# Patient Record
Sex: Female | Born: 1975 | Race: White | Hispanic: No | Marital: Married | Smoking: Former smoker
Health system: Southern US, Community
[De-identification: ages and names within clinical notes are randomized; demographics above are authoritative.]

## PROBLEM LIST (undated history)

## (undated) DIAGNOSIS — Z800 Family history of malignant neoplasm of digestive organs: Secondary | ICD-10-CM

## (undated) DIAGNOSIS — N6019 Diffuse cystic mastopathy of unspecified breast: Secondary | ICD-10-CM

## (undated) DIAGNOSIS — N6012 Diffuse cystic mastopathy of left breast: Secondary | ICD-10-CM

## (undated) DIAGNOSIS — Z803 Family history of malignant neoplasm of breast: Secondary | ICD-10-CM

## (undated) DIAGNOSIS — E079 Disorder of thyroid, unspecified: Secondary | ICD-10-CM

## (undated) DIAGNOSIS — N649 Disorder of breast, unspecified: Secondary | ICD-10-CM

## (undated) HISTORY — DX: Diffuse cystic mastopathy of unspecified breast: N60.19

## (undated) HISTORY — DX: Family history of malignant neoplasm of digestive organs: Z80.0

## (undated) HISTORY — DX: Family history of malignant neoplasm of breast: Z80.3

## (undated) HISTORY — DX: Disorder of breast, unspecified: N64.9

## (undated) HISTORY — PX: BREAST EXCISIONAL BIOPSY: SUR124

## (undated) HISTORY — DX: Diffuse cystic mastopathy of left breast: N60.12

## (undated) HISTORY — DX: Disorder of thyroid, unspecified: E07.9

---

## 1998-01-12 HISTORY — PX: TUBAL LIGATION: SHX77

## 1998-02-17 ENCOUNTER — Observation Stay (HOSPITAL_COMMUNITY): Admission: AD | Admit: 1998-02-17 | Discharge: 1998-02-18 | Payer: Self-pay | Admitting: *Deleted

## 1998-02-21 ENCOUNTER — Inpatient Hospital Stay (HOSPITAL_COMMUNITY): Admission: AD | Admit: 1998-02-21 | Discharge: 1998-02-24 | Payer: Self-pay | Admitting: Obstetrics & Gynecology

## 1998-02-24 ENCOUNTER — Encounter (HOSPITAL_COMMUNITY): Admission: RE | Admit: 1998-02-24 | Discharge: 1998-05-25 | Payer: Self-pay | Admitting: Obstetrics & Gynecology

## 1998-03-30 ENCOUNTER — Ambulatory Visit (HOSPITAL_COMMUNITY): Admission: RE | Admit: 1998-03-30 | Discharge: 1998-03-30 | Payer: Self-pay | Admitting: *Deleted

## 1998-04-01 ENCOUNTER — Inpatient Hospital Stay (HOSPITAL_COMMUNITY): Admission: AD | Admit: 1998-04-01 | Discharge: 1998-04-01 | Payer: Self-pay | Admitting: Obstetrics & Gynecology

## 1998-05-26 ENCOUNTER — Encounter (HOSPITAL_COMMUNITY): Admission: RE | Admit: 1998-05-26 | Discharge: 1998-08-24 | Payer: Self-pay | Admitting: *Deleted

## 2000-08-25 ENCOUNTER — Encounter: Admission: RE | Admit: 2000-08-25 | Discharge: 2000-08-25 | Payer: Self-pay | Admitting: Family Medicine

## 2000-08-25 ENCOUNTER — Encounter: Payer: Self-pay | Admitting: Family Medicine

## 2001-02-15 ENCOUNTER — Encounter: Payer: Self-pay | Admitting: Family Medicine

## 2001-02-15 ENCOUNTER — Encounter: Admission: RE | Admit: 2001-02-15 | Discharge: 2001-02-15 | Payer: Self-pay | Admitting: Family Medicine

## 2002-02-16 ENCOUNTER — Encounter: Payer: Self-pay | Admitting: Family Medicine

## 2002-02-16 ENCOUNTER — Encounter: Admission: RE | Admit: 2002-02-16 | Discharge: 2002-02-16 | Payer: Self-pay | Admitting: Family Medicine

## 2003-02-26 ENCOUNTER — Encounter: Admission: RE | Admit: 2003-02-26 | Discharge: 2003-02-26 | Payer: Self-pay | Admitting: Family Medicine

## 2003-03-06 ENCOUNTER — Encounter: Admission: RE | Admit: 2003-03-06 | Discharge: 2003-03-06 | Payer: Self-pay | Admitting: Family Medicine

## 2004-06-27 ENCOUNTER — Other Ambulatory Visit: Admission: RE | Admit: 2004-06-27 | Discharge: 2004-06-27 | Payer: Self-pay | Admitting: Family Medicine

## 2005-04-23 ENCOUNTER — Encounter: Admission: RE | Admit: 2005-04-23 | Discharge: 2005-04-23 | Payer: Self-pay | Admitting: Family Medicine

## 2006-06-01 ENCOUNTER — Encounter: Admission: RE | Admit: 2006-06-01 | Discharge: 2006-06-01 | Payer: Self-pay | Admitting: Family Medicine

## 2006-06-11 ENCOUNTER — Encounter: Admission: RE | Admit: 2006-06-11 | Discharge: 2006-06-11 | Payer: Self-pay | Admitting: Family Medicine

## 2007-01-13 DIAGNOSIS — E079 Disorder of thyroid, unspecified: Secondary | ICD-10-CM

## 2007-01-13 HISTORY — DX: Disorder of thyroid, unspecified: E07.9

## 2007-01-18 ENCOUNTER — Encounter: Admission: RE | Admit: 2007-01-18 | Discharge: 2007-01-18 | Payer: Self-pay | Admitting: Family Medicine

## 2007-02-22 ENCOUNTER — Encounter: Admission: RE | Admit: 2007-02-22 | Discharge: 2007-02-22 | Payer: Self-pay | Admitting: Family Medicine

## 2007-03-03 ENCOUNTER — Encounter: Admission: RE | Admit: 2007-03-03 | Discharge: 2007-03-03 | Payer: Self-pay | Admitting: Family Medicine

## 2007-04-29 ENCOUNTER — Other Ambulatory Visit: Admission: RE | Admit: 2007-04-29 | Discharge: 2007-04-29 | Payer: Self-pay | Admitting: Family Medicine

## 2007-08-25 ENCOUNTER — Encounter: Admission: RE | Admit: 2007-08-25 | Discharge: 2007-08-25 | Payer: Self-pay | Admitting: Family Medicine

## 2007-08-26 ENCOUNTER — Encounter: Admission: RE | Admit: 2007-08-26 | Discharge: 2007-08-26 | Payer: Self-pay | Admitting: Family Medicine

## 2007-08-26 ENCOUNTER — Encounter (INDEPENDENT_AMBULATORY_CARE_PROVIDER_SITE_OTHER): Payer: Self-pay | Admitting: Diagnostic Radiology

## 2009-07-02 ENCOUNTER — Encounter: Admission: RE | Admit: 2009-07-02 | Discharge: 2009-07-02 | Payer: Self-pay | Admitting: Family Medicine

## 2009-09-30 ENCOUNTER — Other Ambulatory Visit: Admission: RE | Admit: 2009-09-30 | Discharge: 2009-09-30 | Payer: Self-pay | Admitting: Family Medicine

## 2010-02-01 ENCOUNTER — Encounter: Payer: Self-pay | Admitting: Family Medicine

## 2010-02-02 ENCOUNTER — Encounter: Payer: Self-pay | Admitting: Family Medicine

## 2010-08-12 ENCOUNTER — Other Ambulatory Visit: Payer: Self-pay | Admitting: Family Medicine

## 2010-08-12 DIAGNOSIS — N644 Mastodynia: Secondary | ICD-10-CM

## 2010-08-20 ENCOUNTER — Ambulatory Visit
Admission: RE | Admit: 2010-08-20 | Discharge: 2010-08-20 | Disposition: A | Discharge status: Home / Self Care | Payer: 59 | Source: Ambulatory Visit | Attending: Family Medicine | Admitting: Family Medicine

## 2010-08-20 ENCOUNTER — Other Ambulatory Visit: Payer: Self-pay | Admitting: Family Medicine

## 2010-08-20 DIAGNOSIS — N6320 Unspecified lump in the left breast, unspecified quadrant: Secondary | ICD-10-CM

## 2010-08-20 DIAGNOSIS — N644 Mastodynia: Secondary | ICD-10-CM

## 2010-08-20 IMAGING — US US BREAST BILAT
1 series · 13 of 23 positions shown · non-contrast
Comparison: [DATE] and earlier

CLINICAL DATA: Focal pain in the upper inner quadrant of the right
breast. The patient's mother was diagnosed with breast cancer at
age 32 and died of her disease.

DIGITAL DIAGNOSTIC BILATERAL MAMMOGRAM WITH CAD AND BILATERAL
BREAST ULTRASOUND:

[Series 1: us breast bilat · 13 of 23 slices shown]
[im 1/23]
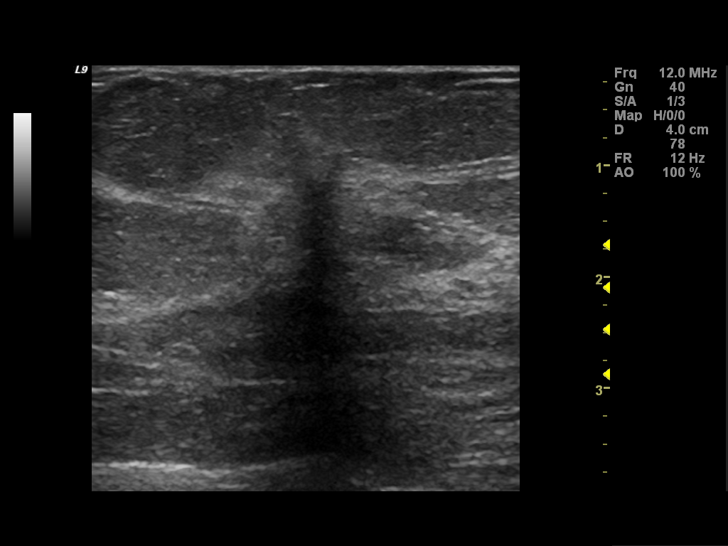
[im 3/23]
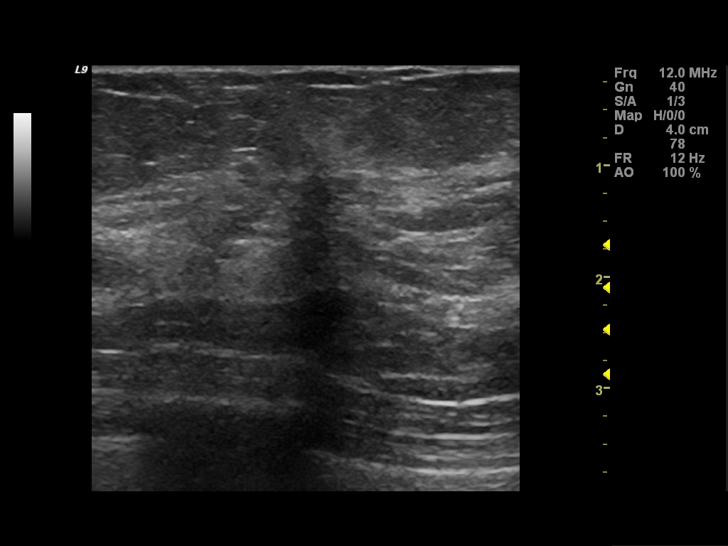
[im 5/23]
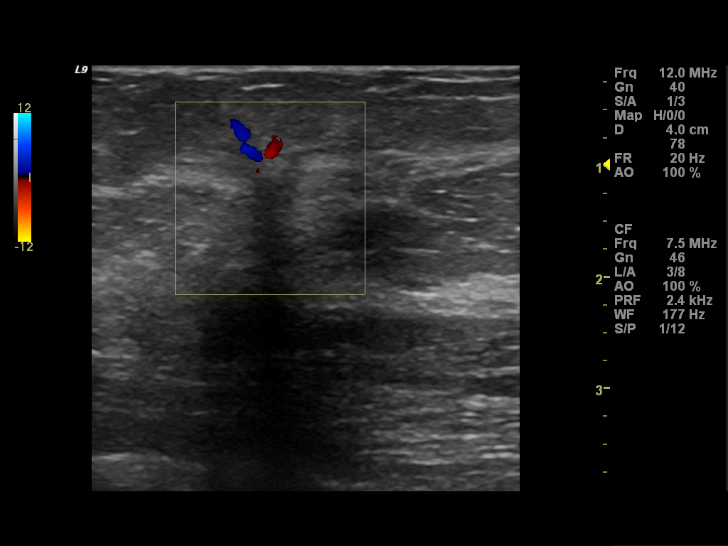
[im 7/23]
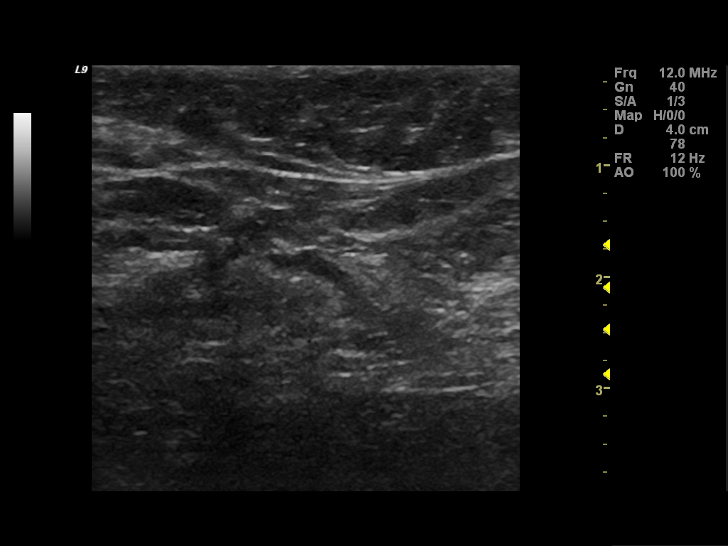
[im 8/23]
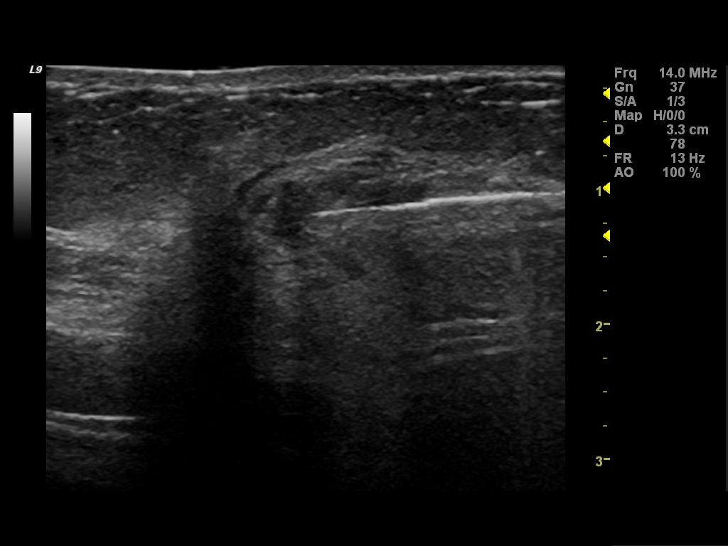
[im 10/23]
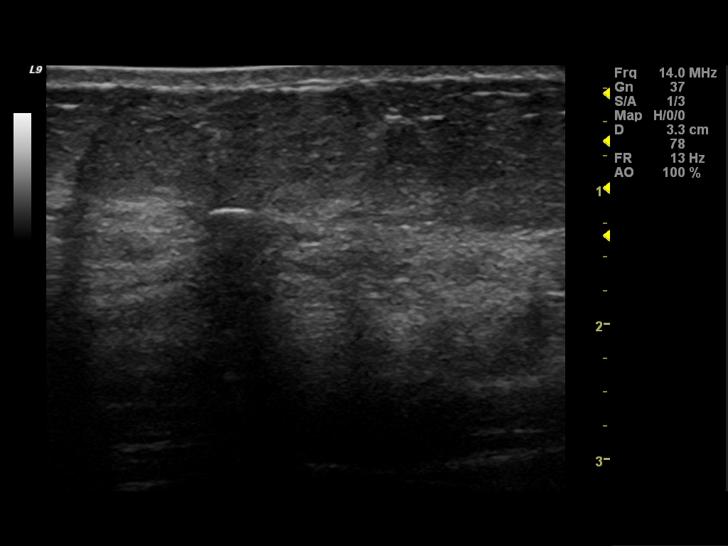
[im 12/23]
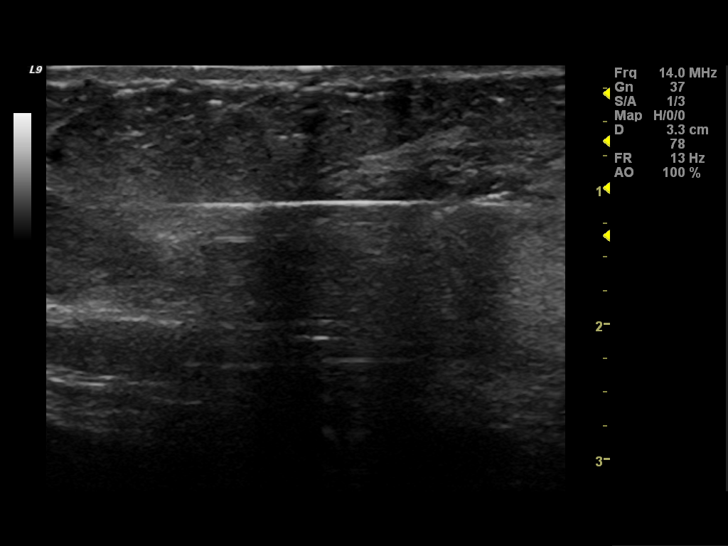
[im 14/23]
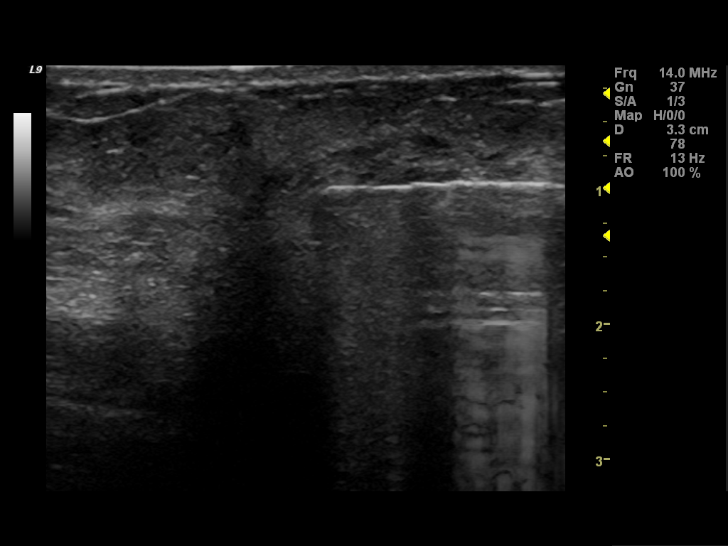
[im 16/23]
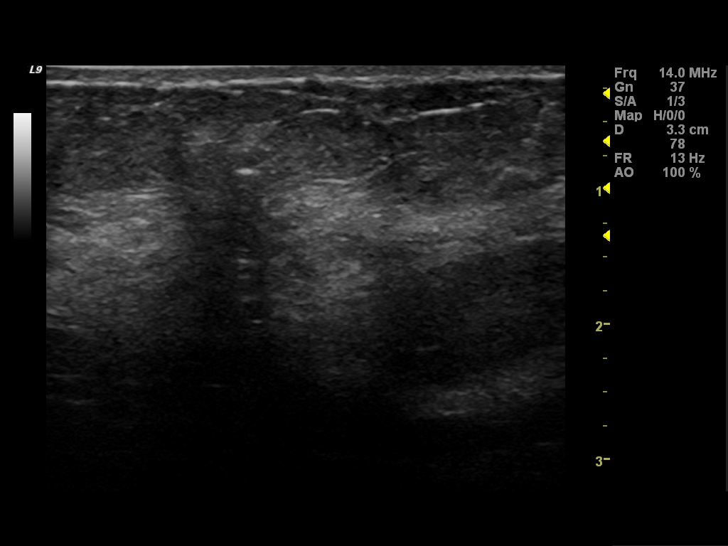
[im 17/23]
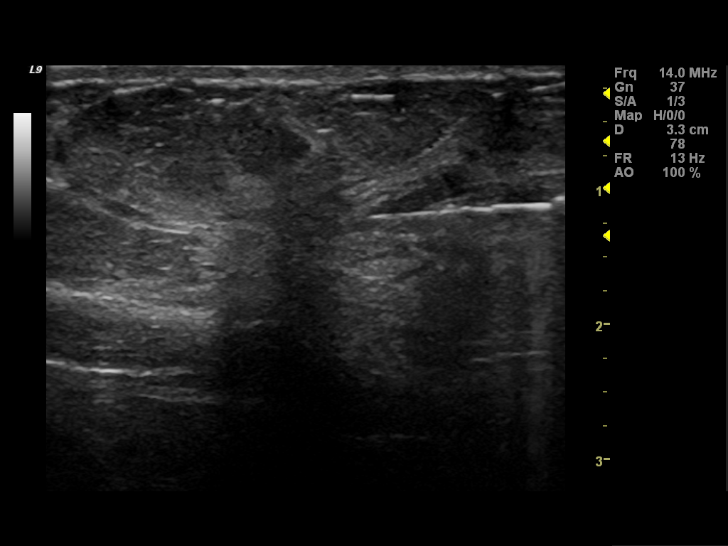
[im 19/23]
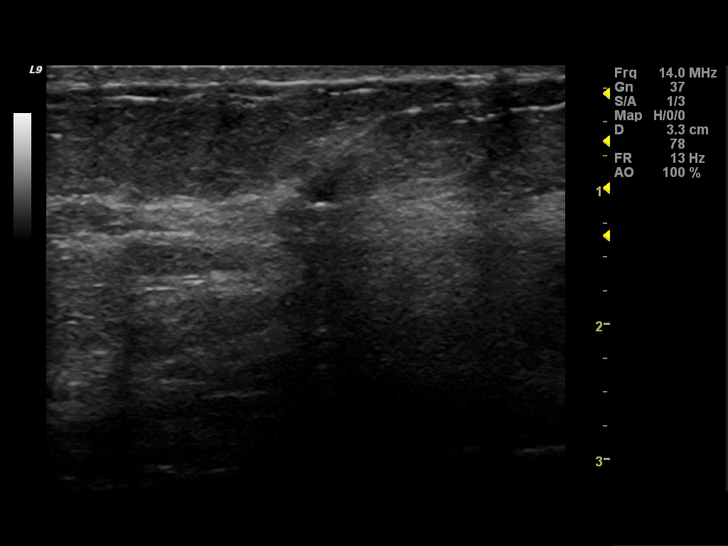
[im 21/23]
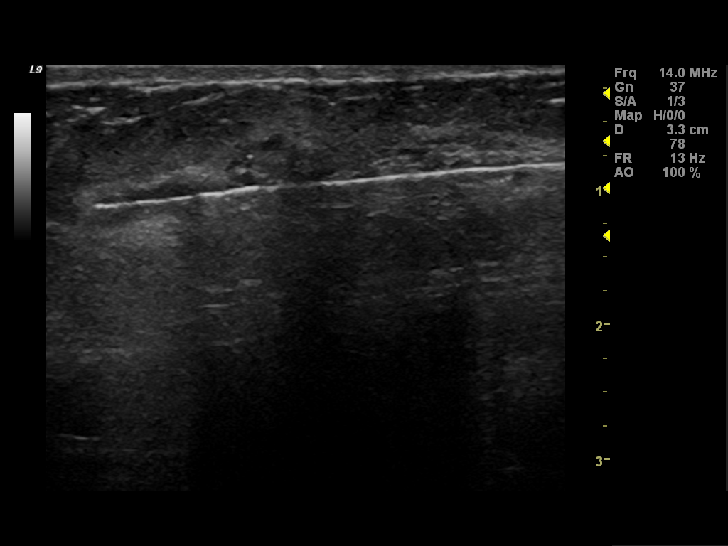
[im 23/23]
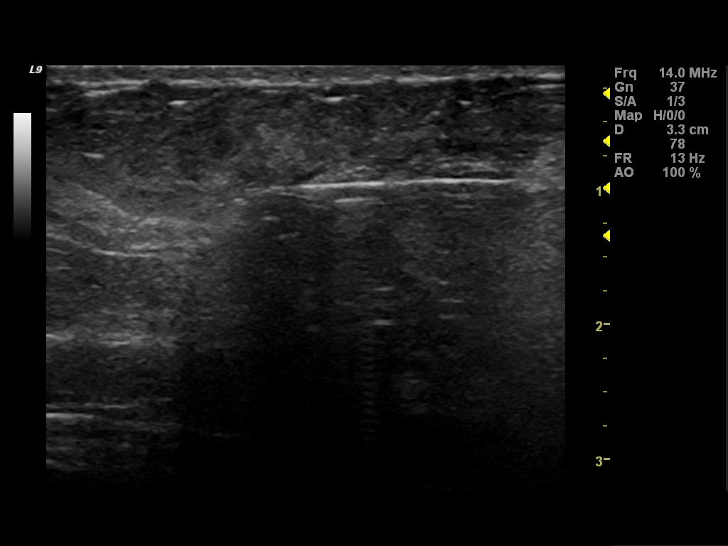

[13 of 23 positions shown; findings below may reference images not displayed]

FINDINGS: Breast parenchyma is heterogeneously dense.  Stable
nodule is identified in the lower inner quadrant of the left
breast, consistent with benign fibroadenoma, followed on multiple
prior ultrasound exams.  Patient has had prior benign biopsy in the
right breast.  A BB marks the area of patient's concern, in the
upper- inner quadrant of the right breast, negative on spot
tangential view.  No suspicious microcalcifications, distortion
identified on the right.

In the upper aspect of the left breast, there is question
distortion, further evaluated with spot compression and rolled
views.  This is thought to be in the medial portion of the left
breast on additional views.
Mammographic images were processed with CAD.

On physical exam, I palpate no abnormality in the upper-outer
quadrant of the right breast, in the area of focal tenderness.  I
palpate no abnormality in the upper quadrant of the left breast, to
correspond with the area of questioned distortion.

Ultrasound is performed, showing normal-appearing fibroglandular
tissue in the upper-outer quadrant of the right breast.

There is an area of acoustic shadowing in the 10 o'clock position
of the left breast 4 cm from the nipple.  This is ill defined and
difficult to measure but is at least 9 x 8 mm.  This correlates
well with the abnormality seen mammographically.  Further
evaluation with ultrasound guided core biopsy is recommended.

Evaluation of the left axilla is negative for suspicious lymph
nodes.
IMPRESSION: 1.  Area of focal pain in the right breast is negative by mammogram
and ultrasound.
2. Area of distortion correlates with a hypoechoic area of
shadowing seen sonographically.  Further evaluation with ultrasound
guided core biopsy is recommended.  The patient requests biopsy
today.  This is performed and dictated separately.

BI-RADS CATEGORY 4:  Suspicious abnormality - biopsy should be
considered.

## 2010-09-08 ENCOUNTER — Ambulatory Visit (INDEPENDENT_AMBULATORY_CARE_PROVIDER_SITE_OTHER): Payer: Self-pay | Admitting: General Surgery

## 2010-09-16 ENCOUNTER — Ambulatory Visit (INDEPENDENT_AMBULATORY_CARE_PROVIDER_SITE_OTHER): Payer: 59 | Admitting: General Surgery

## 2010-09-16 ENCOUNTER — Other Ambulatory Visit (INDEPENDENT_AMBULATORY_CARE_PROVIDER_SITE_OTHER): Payer: Self-pay | Admitting: General Surgery

## 2010-09-16 ENCOUNTER — Encounter (INDEPENDENT_AMBULATORY_CARE_PROVIDER_SITE_OTHER): Payer: Self-pay | Admitting: General Surgery

## 2010-09-16 VITALS — BP 124/88 | HR 88 | Temp 99.1°F | Ht 66.0 in | Wt 174.0 lb

## 2010-09-16 DIAGNOSIS — N63 Unspecified lump in unspecified breast: Secondary | ICD-10-CM

## 2010-09-16 DIAGNOSIS — N6320 Unspecified lump in the left breast, unspecified quadrant: Secondary | ICD-10-CM

## 2010-09-16 DIAGNOSIS — N649 Disorder of breast, unspecified: Secondary | ICD-10-CM | POA: Insufficient documentation

## 2010-09-16 NOTE — Progress Notes (Signed)
Chief Complaint  Patient presents with  . Other    new pt- eval L complex scerlosing lesion on br    HPI Carmen Greene is a 35 y.o. female.   This is a 36 year old female who has a history of a radiologic biopsy on the right breast that was benign 2 years ago. She has a significant family history of breast cancer her mother at age 71 who passed away at age 26. There is no history of ovarian cancer in no one is at any genetic testing. She noted some right breast tenderness and was at the breast center for her followup mammogram when she had an area on the left side noticed by ultrasound. Her right side does not show any abnormalities in the left side showed a lesion that underwent an ultrasound-guided core biopsy. This shows a complex sclerosing lesion or radial scar. She comes in today to discuss a wire-guided biopsy with no other complaints referable to her breasts. HPI  Past Medical History  Diagnosis Date  . Thyroid disease 2009    Graves disease- borderline hypo  . Lesion of breast     left complex sclerosing  . Cyst of breast, left, diffuse fibrocystic   . Cyst of breast, diffuse fibrocystic     right    Past Surgical History  Procedure Date  . Tubal ligation 2000    hulka clips    Family History  Problem Relation Age of Onset  . Cancer Mother 101    BREAST    Social History History  Substance Use Topics  . Smoking status: Former Games developer  . Smokeless tobacco: Not on file   Comment: quit in 2012  . Alcohol Use: No    No Known Allergies  Current Outpatient Prescriptions  Medication Sig Dispense Refill  . ALPRAZolam (XANAX) 0.25 MG tablet Ad lib.      . Multiple Vitamin (MULTIVITAMIN) capsule Take 1 capsule by mouth daily.        . VENTOLIN HFA 108 (90 BASE) MCG/ACT inhaler Ad lib.        Review of Systems Review of Systems  Constitutional: Negative.   HENT: Negative.   Eyes: Negative.   Respiratory: Negative.   Cardiovascular: Negative.   Gastrointestinal:  Negative.   Genitourinary: Negative.   Musculoskeletal: Negative.   Neurological: Negative.   Hematological: Negative.   Psychiatric/Behavioral: Negative.     Blood pressure 124/88, pulse 88, temperature 99.1 F (37.3 C), temperature source Temporal, height 5\' 6"  (1.676 m), weight 174 lb (78.926 kg), last menstrual period 08/16/2010.  Physical Exam Physical Exam  Constitutional: She appears well-developed and well-nourished.  Eyes: No scleral icterus.  Cardiovascular: Normal rate, regular rhythm and normal heart sounds.   Pulmonary/Chest: Effort normal and breath sounds normal. She has no wheezes. She has no rales. Right breast exhibits no inverted nipple, no mass, no nipple discharge, no skin change and no tenderness. Left breast exhibits no inverted nipple, no mass, no nipple discharge, no skin change and no tenderness. Breasts are symmetrical.  Lymphadenopathy:    She has no cervical adenopathy.    She has no axillary adenopathy.      Assessment    Left breast mass, radial scar on core biopsy    Plan   We discussed the rationale for a wire localization biopsy which I think is reasonable given her pathology,age, and family history. We discussed a wire localization biopsy with the risks being but not limited to bleeding, infection as well as not  finding the clip. I'm going to schedule her for a soon as possible.        Aking Klabunde 09/16/2010, 9:44 AM

## 2010-09-19 ENCOUNTER — Encounter (HOSPITAL_BASED_OUTPATIENT_CLINIC_OR_DEPARTMENT_OTHER)
Admission: RE | Admit: 2010-09-19 | Discharge: 2010-09-19 | Disposition: A | Discharge status: Home / Self Care | Payer: 59 | Source: Ambulatory Visit | Attending: General Surgery | Admitting: General Surgery

## 2010-09-19 LAB — DIFFERENTIAL
Basophils Relative: 2 % — ABNORMAL HIGH (ref 0–1)
Eosinophils Relative: 2 % (ref 0–5)
Lymphocytes Relative: 30 % (ref 12–46)
Monocytes Relative: 8 % (ref 3–12)
Neutro Abs: 4.3 10*3/uL (ref 1.7–7.7)

## 2010-09-19 LAB — CBC
HCT: 38.9 % (ref 36.0–46.0)
Hemoglobin: 13.2 g/dL (ref 12.0–15.0)
MCH: 29.3 pg (ref 26.0–34.0)
MCHC: 33.9 g/dL (ref 30.0–36.0)
MCV: 86.3 fL (ref 78.0–100.0)
RBC: 4.51 MIL/uL (ref 3.87–5.11)
RDW: 13.3 % (ref 11.5–15.5)
WBC: 7.3 10*3/uL (ref 4.0–10.5)

## 2010-09-19 LAB — BASIC METABOLIC PANEL
CO2: 29 mEq/L (ref 19–32)
Calcium: 9.9 mg/dL (ref 8.4–10.5)
Chloride: 107 mEq/L (ref 96–112)
Glucose, Bld: 81 mg/dL (ref 70–99)
Sodium: 144 mEq/L (ref 135–145)

## 2010-09-24 ENCOUNTER — Ambulatory Visit (HOSPITAL_BASED_OUTPATIENT_CLINIC_OR_DEPARTMENT_OTHER)
Admission: RE | Admit: 2010-09-24 | Discharge: 2010-09-24 | Disposition: A | Discharge status: Home / Self Care | Payer: 59 | Source: Ambulatory Visit | Attending: General Surgery | Admitting: General Surgery

## 2010-09-24 ENCOUNTER — Ambulatory Visit
Admission: RE | Admit: 2010-09-24 | Discharge: 2010-09-24 | Disposition: A | Discharge status: Home / Self Care | Payer: 59 | Source: Ambulatory Visit | Attending: General Surgery | Admitting: General Surgery

## 2010-09-24 ENCOUNTER — Other Ambulatory Visit (INDEPENDENT_AMBULATORY_CARE_PROVIDER_SITE_OTHER): Payer: Self-pay | Admitting: General Surgery

## 2010-09-24 DIAGNOSIS — N6320 Unspecified lump in the left breast, unspecified quadrant: Secondary | ICD-10-CM

## 2010-09-24 DIAGNOSIS — D249 Benign neoplasm of unspecified breast: Secondary | ICD-10-CM

## 2010-09-24 DIAGNOSIS — Z803 Family history of malignant neoplasm of breast: Secondary | ICD-10-CM | POA: Insufficient documentation

## 2010-09-24 DIAGNOSIS — Z01812 Encounter for preprocedural laboratory examination: Secondary | ICD-10-CM | POA: Insufficient documentation

## 2010-09-24 DIAGNOSIS — N6029 Fibroadenosis of unspecified breast: Secondary | ICD-10-CM | POA: Insufficient documentation

## 2010-09-24 HISTORY — PX: BREAST SURGERY: SHX581

## 2010-09-25 NOTE — Op Note (Signed)
  Carmen Greene, HIGHAM NO.:  [id number]  MEDICAL RECORD NO.:  [id number]  LOCATION:                                 FACILITY:  PHYSICIAN:  Juanetta Gosling, MDDATE OF BIRTH:  [birth year]  DATE OF PROCEDURE:  09/24/2010 DATE OF DISCHARGE:                              OPERATIVE REPORT   PREOPERATIVE DIAGNOSIS:  Left breast mass with radial scar on core biopsy.  POSTOPERATIVE DIAGNOSIS:  Left breast mass with radial scar on core biopsy.  PROCEDURE:  Left breast wire localization biopsy.  SURGEON:  Juanetta Gosling, MD  ASSISTANT:  None.  ANESTHESIA:  General.  SPECIMENS:  Left breast marked with a short superior, long lateral double deep specimen to pathology.  ESTIMATED BLOOD LOSS:  Minimal.  COMPLICATIONS:  None.  DRAINS:  None.  DISPOSITION:  The patient to recovery room in stable condition.  INDICATIONS:  A 35-year female with significant history of family history of breast cancer who noted some right breast tenderness, but then on her evaluation of her left breast lesion.  Her right breast did not show any abnormalities both on exam or radiology.  She underwent a biopsy of the left side lesion showed a complex sclerosing lesion and she and I discussed the wire localization biopsy.  PROCEDURE:  After informed consent was obtained, the patient first had a wire placed by Dr. Si Gaul.  She was then brought to [facility] Day surgery.  I had her mammograms available for my review.  She was then taken to the operating room.  Sequential compression devices were placed on lower extremities.  2 grams of intravenous cefazolin was administered.  She was then placed on general anesthesia with an LMA.  She was then prepped and draped in a standard sterile surgical fashion.  Surgical time-out was then performed.  I infiltrated 0.25% Marcaine throughout the area where the wire was placed.  I then made a curvilinear incision overlying this.  I then excised the  wire as well as the surrounding tissue.  Faxitron mammogram confirmed removal of the lesion in the clip.  Hemostasis was obtained.  I closed this with 2-0 Vicryl, 3-0 Vicryl, 4-0 Monocryl and placed Dermabond overlying that.  She tolerated this well, was extubated in the operating room and transferred to the recovery room in stable condition.     Juanetta Gosling, MD     MCW/MEDQ  D:  09/24/2010  T:  09/24/2010  Job:  [id number]  cc:   Ancil Boozer, MD Anselmo Pickler, MD  Electronically Signed by Emelia Loron MD on 09/25/2010 08:34:11 AM

## 2010-10-07 ENCOUNTER — Encounter (INDEPENDENT_AMBULATORY_CARE_PROVIDER_SITE_OTHER): Payer: Self-pay | Admitting: General Surgery

## 2010-10-07 ENCOUNTER — Ambulatory Visit (INDEPENDENT_AMBULATORY_CARE_PROVIDER_SITE_OTHER): Payer: 59 | Admitting: General Surgery

## 2010-10-07 VITALS — BP 128/82 | HR 80 | Temp 98.2°F | Resp 16 | Ht 66.5 in | Wt 174.8 lb

## 2010-10-07 DIAGNOSIS — N649 Disorder of breast, unspecified: Secondary | ICD-10-CM

## 2010-10-07 DIAGNOSIS — Z09 Encounter for follow-up examination after completed treatment for conditions other than malignant neoplasm: Secondary | ICD-10-CM

## 2010-10-07 NOTE — Progress Notes (Signed)
Subjective:     Patient ID: Carmen Greene, female   DOB: July 01, 1975, 35 y.o.   MRN: [id number]  HPI This is a 35 year old female who had a left breast abnormality on biopsy. She underwent a wire localization biopsy showing a complex sclerosing lesion with no evidence of malignancy. She done well since her surgery without any complaints.  Review of Systems     Objective:   Physical Exam Healing left breast incision without infection    Assessment:     S/p left breast biopsy with CSL    Plan:        We discussed her biopsy results today. We discussed future screening for breast cancer including her annual mammograms, monthly self exams, and yearly clinical exam. I asked her to come back and see me as needed.

## 2010-10-07 NOTE — Patient Instructions (Signed)
Breast Problems and Self Exam Completing monthly breast exams may pick up problems early and save lives. There can be numerous causes of swelling, tenderness or lumps in the breasts. Some of these causes are:   Fibrocystic breast syndrome (noncancerous lumps). This is the most common cause of lumps in the breast.   Fibroadenoma breast tumors of unknown cause. These are noncancerous (benign) lumps.   Benign fatty tumors (lipomas).   Cancer of the breast.  By doing monthly breast exams, you get to know how your breasts feel and how they can change from month to month. This allows you to notice changes early. It can also offer you some reassurance that your breast health is good.  BREAST SELF EXAM There are a few points to follow when doing a thorough breast exam. The best time to examine your breasts is 5 to 7 days after your menstrual period is over. During menstruation, the breasts are lumpier, and it may be more difficult to pick up changes. If you do not menstruate, have reached menopause, or had a hysterectomy (uterus removal), examine your breasts the first day of every month. After 3 to 4 months, you will become more familiar with the variations of your breasts and more comfortable with the exam.  Perform your breast exam monthly. Keep a written record with breast changes or normal findings for each breast. This makes it easier to be sure of changes, so you do not need to depend only on memory for size, tenderness, or location. Try to do the exam at the same time each month, and write down where you are in your menstrual cycle, if you are still menstruating.   Look at your breasts. Stand in front of a mirror with your hands clasped behind your head. Tighten your chest muscles and look for asymmetry. This means a difference in shape or contour from one breast to the other, such as puckers, dips or bumps. Also, look for skin changes.    Lean forward with your hands on your hips. Again, look for  symmetry and skin changes.   While showering, soap the breasts. Then, carefully feel the breasts with your fingertips, while holding the other arm (on the side of the breast you are examining) over your head. Do this with each breast, carefully feeling for lumps or changes. Typically, a circular motion with moderate fingertip pressure should be used.    Repeat this exam while lying on your back. Put your arm behind your head and a pillow under your shoulders. Again, use your fingertips to examine both breasts, feeling for lumps and thickening. Begin at the top of your breast, and go clockwise around the whole breast.   At the end of your exam, gently squeeze each nipple to see if there is any drainage of fluids. Look for nipple changes, dimpling, or redness.   Lastly, examine the upper chest and collarbone (clavicle) areas, and in your armpits.  It is not necessary to be alarmed if you find a breast lump. Most of them are not cancerous. However, it is necessary to see your caregiver if a lump is found, in order to have it looked at. Document Released: 12/29/2004 Document Re-Released: 01/20/2009 Exit[employer] Patient Information 2011 [employer]. 

## 2011-10-28 ENCOUNTER — Other Ambulatory Visit: Payer: Self-pay | Admitting: Family Medicine

## 2011-10-28 DIAGNOSIS — Z1231 Encounter for screening mammogram for malignant neoplasm of breast: Secondary | ICD-10-CM

## 2011-12-08 ENCOUNTER — Ambulatory Visit
Admission: RE | Admit: 2011-12-08 | Discharge: 2011-12-08 | Disposition: A | Discharge status: Home / Self Care | Payer: 59 | Source: Ambulatory Visit | Attending: Family Medicine | Admitting: Family Medicine

## 2011-12-08 DIAGNOSIS — Z1231 Encounter for screening mammogram for malignant neoplasm of breast: Secondary | ICD-10-CM

## 2012-12-05 ENCOUNTER — Other Ambulatory Visit (HOSPITAL_COMMUNITY)
Admission: RE | Admit: 2012-12-05 | Discharge: 2012-12-05 | Disposition: A | Discharge status: Home / Self Care | Payer: 59 | Source: Ambulatory Visit | Attending: Family Medicine | Admitting: Family Medicine

## 2012-12-05 ENCOUNTER — Other Ambulatory Visit: Payer: Self-pay

## 2012-12-05 DIAGNOSIS — Z01419 Encounter for gynecological examination (general) (routine) without abnormal findings: Secondary | ICD-10-CM | POA: Insufficient documentation

## 2013-01-26 ENCOUNTER — Other Ambulatory Visit: Payer: Self-pay

## 2013-01-26 DIAGNOSIS — Z1231 Encounter for screening mammogram for malignant neoplasm of breast: Secondary | ICD-10-CM

## 2013-02-21 ENCOUNTER — Ambulatory Visit
Admission: RE | Admit: 2013-02-21 | Discharge: 2013-02-21 | Disposition: A | Discharge status: Home / Self Care | Payer: 59 | Source: Ambulatory Visit

## 2013-02-21 DIAGNOSIS — Z1231 Encounter for screening mammogram for malignant neoplasm of breast: Secondary | ICD-10-CM

## 2013-12-06 ENCOUNTER — Encounter: Payer: Self-pay | Admitting: Internal Medicine

## 2014-07-27 ENCOUNTER — Other Ambulatory Visit: Payer: Self-pay

## 2014-07-27 DIAGNOSIS — Z1231 Encounter for screening mammogram for malignant neoplasm of breast: Secondary | ICD-10-CM

## 2014-07-31 ENCOUNTER — Ambulatory Visit
Admission: RE | Admit: 2014-07-31 | Discharge: 2014-07-31 | Disposition: A | Discharge status: Home / Self Care | Payer: BC Managed Care – PPO | Source: Ambulatory Visit

## 2014-07-31 DIAGNOSIS — Z1231 Encounter for screening mammogram for malignant neoplasm of breast: Secondary | ICD-10-CM

## 2015-07-22 ENCOUNTER — Other Ambulatory Visit: Payer: Self-pay | Admitting: *Deleted

## 2015-07-30 ENCOUNTER — Other Ambulatory Visit: Payer: Self-pay | Admitting: Family Medicine

## 2015-07-30 DIAGNOSIS — Z139 Encounter for screening, unspecified: Secondary | ICD-10-CM

## 2015-08-01 ENCOUNTER — Encounter: Payer: Self-pay | Admitting: Internal Medicine

## 2015-08-01 ENCOUNTER — Other Ambulatory Visit (HOSPITAL_COMMUNITY)
Admission: RE | Admit: 2015-08-01 | Discharge: 2015-08-01 | Disposition: A | Discharge status: Home / Self Care | Payer: BC Managed Care – PPO | Source: Ambulatory Visit | Attending: Family Medicine | Admitting: Family Medicine

## 2015-08-01 ENCOUNTER — Other Ambulatory Visit: Payer: Self-pay | Admitting: Family Medicine

## 2015-08-01 DIAGNOSIS — Z01419 Encounter for gynecological examination (general) (routine) without abnormal findings: Secondary | ICD-10-CM | POA: Insufficient documentation

## 2015-08-05 LAB — CYTOLOGY - PAP

## 2015-08-08 ENCOUNTER — Ambulatory Visit
Admission: RE | Admit: 2015-08-08 | Discharge: 2015-08-08 | Disposition: A | Discharge status: Home / Self Care | Payer: BC Managed Care – PPO | Source: Ambulatory Visit | Attending: Family Medicine | Admitting: Family Medicine

## 2015-08-08 DIAGNOSIS — Z139 Encounter for screening, unspecified: Secondary | ICD-10-CM

## 2015-10-04 ENCOUNTER — Ambulatory Visit (INDEPENDENT_AMBULATORY_CARE_PROVIDER_SITE_OTHER): Payer: BC Managed Care – PPO | Admitting: Internal Medicine

## 2015-10-04 ENCOUNTER — Ambulatory Visit: Payer: BC Managed Care – PPO | Admitting: Internal Medicine

## 2015-10-04 ENCOUNTER — Telehealth: Payer: Self-pay

## 2015-10-04 ENCOUNTER — Encounter: Payer: Self-pay | Admitting: Internal Medicine

## 2015-10-04 VITALS — BP 124/82 | HR 69 | Ht 67.0 in | Wt 164.0 lb

## 2015-10-04 DIAGNOSIS — E890 Postprocedural hypothyroidism: Secondary | ICD-10-CM | POA: Diagnosis not present

## 2015-10-04 LAB — T4, FREE: Free T4: 1.34 ng/dL (ref 0.60–1.60)

## 2015-10-04 LAB — T3, FREE: T3 FREE: 3.2 pg/mL (ref 2.3–4.2)

## 2015-10-04 LAB — TSH: TSH: 1.45 u[IU]/mL (ref 0.35–4.50)

## 2015-10-04 MED ORDER — SYNTHROID 112 MCG PO TABS: ORAL_TABLET | ORAL | 1 refills | Status: DC

## 2015-10-04 MED ORDER — LEVOTHYROXINE SODIUM 125 MCG PO TABS: ORAL_TABLET | ORAL | 1 refills | Status: DC

## 2015-10-04 NOTE — Telephone Encounter (Signed)
Called patient and gave lab results. Patient had no questions or concerns.  

## 2015-10-04 NOTE — Patient Instructions (Signed)
Please continue alternating Levothyroxine 112 with 125 mcg every other day.  We will switch to Synthroid after the results are back.  Please stop at the lab.  Please come back for a follow-up appointment in 3 months. Marland Kitchen

## 2015-10-04 NOTE — Progress Notes (Addendum)
1 order the Patient ID: Carmen Greene, female   DOB: 1975-03-28, 40 y.o.   MRN: [id number]   HPI  Carmen Greene is a 40 y.o.-year-old female, referred by her PCP, Dr. Carlena Sax, for management of hypothyroidism.  Pt. 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  She takes the thyroid hormone: - fasting - with water - separated by >45 min from b'fast and coffee + cream - + calcium at night - no iron, PPIs - + multivitamins in the evening  I reviewed pt's thyroid tests - per records brought by pt: 07/31/2105: TSH 2.52 - alternating the 112 with 125 mcg qod LT4 10/31/2014: TSH 0.47 - alternating the 112 with 125 mcg qod LT4 09/14/2014: TSH 0.442 - on 125 mcg daily ... 01/18/2013: TSH 2.56 12/05/2012: TSH 6.88 07/18/2012: TSH 1.29 12/25/2011: TSH 2.45 11/23/2011: TSH 36.45, free T4 4.6 >> started LT4 08/12/2010: TSH 4.93, free T4 0.91 09/30/2009: TSH 6.26, normal free t4  She feels best when TSH around 1-1.5.   Pt describes: - + weight gain: 60 lbs since hypothyr dx >> then lost 45 lbs after she started exercise 06/2014 - + anxiety - + hair loss -  + fatigue - + cold intolerance - no depression - no constipation - no dry skin  Pt denies feeling nodules in neck, hoarseness, dysphagia/odynophagia, SOB with lying down.  She has no FH of thyroid disorders. No FH of thyroid cancer.  No h/o radiation tx to head or neck. No recent use of iodine supplements.  ROS: Constitutional: no weight gain/loss, no fatigue, no subjective hyperthermia/hypothermia Eyes: no blurry vision, no xerophthalmia ENT: no sore throat, no nodules palpated in throat, no dysphagia/odynophagia, no hoarseness Cardiovascular: no CP/SOB/palpitations/leg swelling Respiratory: no cough/SOB Gastrointestinal: no N/V/D/C Musculoskeletal: no muscle/joint aches Skin: no rashes, + hair loss, +  easy bruising Neurological: no tremors/numbness/tingling/dizziness Psychiatric: no depression/+ anxiety  Past Medical History:  Diagnosis Date  . Cyst of breast, diffuse fibrocystic    right  . Cyst of breast, left, diffuse fibrocystic   . Lesion of breast    left complex sclerosing  . Thyroid disease 2009   Graves disease- borderline hypo   Past Surgical History:  Procedure Laterality Date  . BREAST SURGERY  09/24/10   lft- benign sclerosing lesion  . TUBAL LIGATION  2000   hulka clips   Social History   Social History  . Marital status: Married    Spouse name: N/A  . 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 Smoking status: Former Research scientist (life sciences)  . Smokeless tobacco: Not on file     Comment: quit in 2012  . Alcohol use 2 drinks once a week: Wine, beer, liquor    . Drug use: No   Current Outpatient Prescriptions on File Prior to Visit  Medication Sig Dispense Refill  . ALPRAZolam (XANAX) 0.25 MG tablet Ad lib.    . Multiple Vitamin (MULTIVITAMIN) capsule Take 1 capsule by mouth daily.      . VENTOLIN HFA 108 (90 BASE) MCG/ACT inhaler Ad lib.     No current facility-administered medications on file prior to visit.    No Known Allergies   Family History  Problem Relation Age of Onset  . Cancer Mother 44    BREAST  Also, - Diabetes in paternal grandmother - Hypertension in heart disease in maternal grandmother  PE: BP 124/82 (BP Location: Left Arm, Patient Position: Sitting)   Pulse 69   Ht 5\' 7"  (1.702 m)   Wt 164 lb (74.4 kg)   LMP 09/24/2015   SpO2 98%   BMI 25.69 kg/m  Wt Readings from Last 3 Encounters:  10/04/15 164 lb (74.4 kg)  10/07/10 174 lb 12.8 oz (79.3 kg)  09/16/10 174 lb (78.9 kg)   Constitutional: overweight, in NAD Eyes: PERRLA, EOMI, no exophthalmos ENT: moist mucous membranes, no thyromegaly, no cervical lymphadenopathy Cardiovascular: RRR, No  MRG Respiratory: CTA B Gastrointestinal: abdomen soft, NT, ND, BS+ Musculoskeletal: no deformities, strength intact in all 4 Skin: moist, warm, no rashes Neurological: no tremor with outstretched hands, DTR normal in all 4  ASSESSMENT: 1. Post ablative Hypothyroidism - Developed in 2011, after having had RAI treatment for Graves' disease in 2009  PLAN:  1. Patient with long-standing  post ablative hypothyroidism, on levothyroxine therapy. She has had fluctuating TFTs over the years, despite not missing doses of LT4 and taking it correctly.  - she appears euthyroid, but has several sxs that could be related to her hypothyroidism: fatigue, lower exercise tolerance when TSH is in the high normal range, cold intolerance.  - she does not appear to have a goiter, thyroid nodules, or neck compression symptoms - We discussed about correct intake of levothyroxine, fasting, with water, separated by at least 30 minutes from breakfast, and separated by more than 4 hours from calcium, iron, multivitamins, acid reflux medications (PPIs). She is taking it correctly. - will check thyroid tests today: TSH, free T4, free T3 >> after the results are back >> I suggested to switch to brand name Synthroid to hopefully avoid more fluctuations in the tests. She agrees. Discount coupon given. - If labs today are abnormal, she will need to return in 6 weeks for repeat labs - Otherwise, I will see her back in 3 months  Component     Latest Ref Rng & Units 10/04/2015  T4,Free(Direct)     0.60 - 1.60 ng/dL 1.34  Triiodothyronine,Free,Serum     2.3 - 4.2 pg/mL 3.2  TSH     0.35 - 4.50 uIU/mL 1.45   TFTs are normal. We'll continue the current dose of levothyroxine, but switched to brand name Synthroid, as discussed.  Philemon Kingdom, MD PhD [facility]

## 2015-10-04 NOTE — Addendum Note (Signed)
Addended by: Philemon Kingdom on: 10/04/2015 01:01 PM   Modules accepted: Orders

## 2016-01-03 ENCOUNTER — Ambulatory Visit (INDEPENDENT_AMBULATORY_CARE_PROVIDER_SITE_OTHER): Payer: BC Managed Care – PPO | Admitting: Internal Medicine

## 2016-01-03 ENCOUNTER — Encounter: Payer: Self-pay | Admitting: Internal Medicine

## 2016-01-03 DIAGNOSIS — E890 Postprocedural hypothyroidism: Secondary | ICD-10-CM | POA: Insufficient documentation

## 2016-01-03 LAB — TSH: TSH: 1.56 u[IU]/mL (ref 0.35–4.50)

## 2016-01-03 LAB — T4, FREE: Free T4: 1.36 ng/dL (ref 0.60–1.60)

## 2016-01-03 MED ORDER — SYNTHROID 112 MCG PO TABS: ORAL_TABLET | ORAL | 1 refills | Status: DC

## 2016-01-03 MED ORDER — LEVOTHYROXINE SODIUM 125 MCG PO TABS: ORAL_TABLET | ORAL | 1 refills | Status: DC

## 2016-01-03 MED ORDER — SYNTHROID 125 MCG PO TABS: ORAL_TABLET | ORAL | 1 refills | Status: DC

## 2016-01-03 MED ORDER — SYNTHROID 112 MCG PO TABS: ORAL_TABLET | ORAL | 3 refills | Status: DC

## 2016-01-03 MED ORDER — SYNTHROID 125 MCG PO TABS: ORAL_TABLET | ORAL | 3 refills | Status: DC

## 2016-01-03 NOTE — Patient Instructions (Signed)
Please continue alternating Synthroid 112 with 125 mcg every other day.  Please stop at the lab.  Please come back for a follow-up appointment in 6 months. Marland Kitchen

## 2016-01-03 NOTE — Progress Notes (Signed)
Patient ID: Carmen Greene, female   DOB: [birth year], 40 y.o.   MRN: [id number]   HPI  Carmen Greene is a 40 y.o.-year-old female, initially referred by her PCP, Dr. Carlena Sax, returning for follow-up for hypothyroidism. Last visit 3 months ago  Reviewed and addended history: Pt. 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 In 09/2015, we changed to brand name Synthroid.  She feels much better on Synthroid!  She takes the thyroid hormone: - fasting - with water - separated by 1h from b'fast and coffee + cream - + calcium at night - no iron, PPIs - + multivitamins in the evening  I reviewed pt's thyroid tests  Lab Results  Component Value Date   TSH 1.45 10/04/2015   FREET4 1.34 10/04/2015   - Also, per records brought by pt: 07/31/2105: TSH 2.52 - alternating the 112 with 125 mcg qod LT4 10/31/2014: TSH 0.47 - alternating the 112 with 125 mcg qod LT4 09/14/2014: TSH 0.442 - on 125 mcg daily ... 01/18/2013: TSH 2.56 12/05/2012: TSH 6.88 07/18/2012: TSH 1.29 12/25/2011: TSH 2.45 11/23/2011: TSH 36.45, free T4 4.6 >> started LT4 08/12/2010: TSH 4.93, free T4 0.91 09/30/2009: TSH 6.26, normal free t4  She feels best when TSH around 1-1.5.   Pt describes: - no more weight gain -she had gained 60 lbs since hypothyr dx >> then lost 45 lbs after she started exercise 06/2014 - improved anxiety - + hair loss - no fatigue - + cold intolerance - no depression - no constipation - no dry skin  Pt denies feeling nodules in neck, hoarseness, dysphagia/odynophagia, SOB with lying down.  She has no FH of thyroid disorders. No FH of thyroid cancer.  No h/o radiation tx to head or neck. No recent use of iodine supplements.  ROS: Constitutional: no weight gain/loss, no fatigue, no subjective hyperthermia/hypothermia Eyes: no blurry vision, no xerophthalmia ENT: no  sore throat, no nodules palpated in throat, no dysphagia/odynophagia, no hoarseness Cardiovascular: no CP/SOB/palpitations/leg swelling Respiratory: no cough/SOB Gastrointestinal: no N/V/D/C Musculoskeletal: no muscle/joint aches Skin: no rashes, + hair loss, + easy bruising Neurological: no tremors/numbness/tingling/dizziness  I reviewed pt's medications, allergies, PMH, social hx, family hx, and changes were documented in the history of present illness. Otherwise, unchanged from my initial visit note.  Past Medical History:  Diagnosis Date  . Cyst of breast, diffuse fibrocystic    right  . Cyst of breast, left, diffuse fibrocystic   . Lesion of breast    left complex sclerosing  . Thyroid disease 2009   Graves disease- borderline hypo   Past Surgical History:  Procedure Laterality Date  . BREAST SURGERY  09/24/10   lft- benign sclerosing lesion  . TUBAL LIGATION  2000   hulka clips   Social History   Social History  . Marital status: Married    Spouse name: N/A  . 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 Smoking status: Former Research scientist (life sciences)  . Smokeless tobacco: Not on file     Comment: quit in 2012  . Alcohol use 2 drinks once a week: Wine, beer, liquor    . Drug use: No   Current Outpatient Prescriptions on File Prior to Visit  Medication Sig Dispense Refill  . ALPRAZolam (XANAX) 0.25 MG tablet Ad lib.    Marland Kitchen  Multiple Vitamin (MULTIVITAMIN) capsule Take 1 capsule by mouth daily.      . VENTOLIN HFA 108 (90 BASE) MCG/ACT inhaler Ad lib.    . vitamin C (ASCORBIC ACID) 500 MG tablet Take 500 mg by mouth daily.     No current facility-administered medications on file prior to visit.    No Known Allergies   Family History  Problem Relation Age of Onset  . Cancer Mother 48    BREAST  Also, - Diabetes in paternal grandmother - Hypertension in heart disease in maternal  grandmother  PE: BP 112/69 (BP Location: Left Arm, Patient Position: Sitting, Cuff Size: Normal)   Pulse 70   Wt 163 lb 3.2 oz (74 kg)   LMP 12/31/2015 (Approximate)   SpO2 97%   BMI 25.56 kg/m  Wt Readings from Last 3 Encounters:  01/03/16 163 lb 3.2 oz (74 kg)  10/04/15 164 lb (74.4 kg)  10/07/10 174 lb 12.8 oz (79.3 kg)   Constitutional: overweight, in NAD Eyes: PERRLA, EOMI, no exophthalmos ENT: moist mucous membranes, no thyromegaly, no cervical lymphadenopathy Cardiovascular: RRR, No MRG Respiratory: CTA B Gastrointestinal: abdomen soft, NT, ND, BS+ Musculoskeletal: no deformities, strength intact in all 4 Skin: moist, warm, no rashes Neurological: no tremor with outstretched hands, DTR normal in all 4  ASSESSMENT: 1. Post ablative Hypothyroidism - Developed in 2011, after having had RAI treatment for Graves' disease in 2009  PLAN:  1. Patient with long-standing  post ablative hypothyroidism, on Synthroid DAW therapy now. She had fluctuating TFTs over the years, despite not missing doses of LT4 and taking it correctly, therefore, we switched to brand name Synthroid at last visit. She feels MUCH better >> less fatigue, less joint pains, less HAs and hair loss. - she does not appear to have a goiter, thyroid nodules, or neck compression symptoms - We discussed about correct intake of levothyroxine, fasting, with water, separated by at least 30 minutes from breakfast, and separated by more than 4 hours from calcium, iron, multivitamins, acid reflux medications (PPIs). She is taking it correctly. - will check thyroid tests today: TSH, free T4 - If labs today are abnormal, she will need to return in 6 weeks for repeat labs - Otherwise, I will see her back in 6 months  Needs refills.  Component     Latest Ref Rng & Units 01/03/2016  T4,Free(Direct)     0.60 - 1.60 ng/dL 1.36  TSH     0.35 - 4.50 uIU/mL 1.56   Excellent TFTs.  Philemon Kingdom, MD PhD [facility]  Endocrinology

## 2016-01-03 NOTE — Progress Notes (Signed)
Pre visit review using our clinic review tool, if applicable. No additional management support is needed unless otherwise documented below in the visit note. 

## 2016-06-09 ENCOUNTER — Telehealth: Payer: Self-pay | Admitting: Internal Medicine

## 2016-06-09 ENCOUNTER — Encounter: Payer: Self-pay | Admitting: Internal Medicine

## 2016-06-09 NOTE — Telephone Encounter (Signed)
Pt has been rescheduled and this message has been addressed

## 2016-06-09 NOTE — Telephone Encounter (Signed)
° °  Patient called back regarding reschedule. Advised of current availability in September. 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   She states that she is available any day and time from 06/22/2016 to 08/28/2016. She is asking to be worked in. She is accessible on her cell

## 2016-07-20 ENCOUNTER — Ambulatory Visit: Payer: BC Managed Care – PPO | Admitting: Internal Medicine

## 2016-07-21 ENCOUNTER — Ambulatory Visit: Payer: BC Managed Care – PPO | Admitting: Internal Medicine

## 2016-07-30 ENCOUNTER — Ambulatory Visit: Payer: BC Managed Care – PPO | Admitting: Internal Medicine

## 2016-08-12 ENCOUNTER — Other Ambulatory Visit: Payer: Self-pay | Admitting: Family Medicine

## 2016-08-12 DIAGNOSIS — Z1231 Encounter for screening mammogram for malignant neoplasm of breast: Secondary | ICD-10-CM

## 2016-08-18 ENCOUNTER — Ambulatory Visit (INDEPENDENT_AMBULATORY_CARE_PROVIDER_SITE_OTHER): Payer: BC Managed Care – PPO | Admitting: Internal Medicine

## 2016-08-18 ENCOUNTER — Other Ambulatory Visit: Payer: Self-pay | Admitting: Internal Medicine

## 2016-08-18 ENCOUNTER — Encounter: Payer: Self-pay | Admitting: Internal Medicine

## 2016-08-18 VITALS — BP 120/70 | HR 76 | Wt 169.0 lb

## 2016-08-18 DIAGNOSIS — E890 Postprocedural hypothyroidism: Secondary | ICD-10-CM | POA: Diagnosis not present

## 2016-08-18 LAB — T4, FREE: Free T4: 1.24 ng/dL (ref 0.60–1.60)

## 2016-08-18 LAB — TSH: TSH: 0.88 u[IU]/mL (ref 0.35–4.50)

## 2016-08-18 NOTE — Patient Instructions (Signed)
Please continue alternating Synthroid 112 with 125 mcg every other day.  Please stop at the lab.  Please come back for a follow-up appointment in 1 year. . 

## 2016-08-18 NOTE — Progress Notes (Signed)
Patient ID: Carmen Greene, female   DOB: 04/25/1975, 41 y.o.   MRN: [id number]   HPI  Carmen Greene is a 41 y.o.-year-old female, initially referred by her PCP, Dr. Carlena Sax, returning for follow-up for hypothyroidism. Last visit 7.5 months ago.  Reviewed hx: Pt. 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 In 09/2015, we changed to brand name Synthroid.  She continues to feel much better on the Synthroid brand name.   Pt is on Synthroid 125 alternating with 112 g every other day: - in am - fasting - at least 1h from coffee + creamer - at least 30 min from b'fast - + Ca at night, no Fe, + MVI at night, no PPIs - not on Biotin  I reviewed pt's thyroid tests  Lab Results  Component Value Date   TSH 1.56 01/03/2016   TSH 1.45 10/04/2015   FREET4 1.36 01/03/2016   FREET4 1.34 10/04/2015   Prev: 07/31/2105: TSH 2.52 - alternating the 112 with 125 mcg qod LT4 10/31/2014: TSH 0.47 - alternating the 112 with 125 mcg qod LT4 09/14/2014: TSH 0.442 - on 125 mcg daily ... 01/18/2013: TSH 2.56 12/05/2012: TSH 6.88 07/18/2012: TSH 1.29 12/25/2011: TSH 2.45 11/23/2011: TSH 36.45, free T4 4.6 >> started LT4 08/12/2010: TSH 4.93, free T4 0.91 09/30/2009: TSH 6.26, normal free t4  She feels best when TSH around 1-1.5.   Pt describes: - No more weight gain. She had gained 60 pounds after her hypothyroidism diagnosis, then lost 45 pounds after she started exercise in 06/2014.  Pt denies: - feeling nodules in neck - hoarseness - dysphagia - choking - SOB with lying down  She has no FH of thyroid disorders. No FH of thyroid cancer. No h/o radiation tx to head or neck.  No seaweed or kelp. No recent contrast studies. No herbal supplements. No Biotin use. No recent steroids use.   ROS: Constitutional: no weight gain/no weight loss, + fatigue, no subjective  hyperthermia, no subjective hypothermia Eyes: no blurry vision, no xerophthalmia ENT: no sore throat, no nodules palpated in throat, no dysphagia, no odynophagia, no hoarseness Cardiovascular: no CP/no SOB/no palpitations/no leg swelling Respiratory: no cough/no SOB/no wheezing Gastrointestinal: no N/no V/no D/+ C/no acid reflux Musculoskeletal: no muscle aches/no joint aches Skin: no rashes, no hair loss Neurological: no tremors/no numbness/no tingling/no dizziness  I reviewed pt's medications, allergies, PMH, social hx, family hx, and changes were documented in the history of present illness. Otherwise, unchanged from my initial visit note.  Past Medical History:  Diagnosis Date  . Cyst of breast, diffuse fibrocystic    right  . Cyst of breast, left, diffuse fibrocystic   . Lesion of breast    left complex sclerosing  . Thyroid disease 2009   Graves disease- borderline hypo   Past Surgical History:  Procedure Laterality Date  . BREAST SURGERY  09/24/10   lft- benign sclerosing lesion  . TUBAL LIGATION  2000   hulka clips   Social History   Social History  . Marital status: Married    Spouse name: N/A  . 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 Smoking status: Former Research scientist (life sciences)  . Smokeless tobacco: Not on file     Comment: quit in 2012  . Alcohol use 2 drinks  once a week: Wine, beer, liquor    . Drug use: No   Current Outpatient Prescriptions on File Prior to Visit  Medication Sig Dispense Refill  . ALPRAZolam (XANAX) 0.25 MG tablet Ad lib.    . Multiple Vitamin (MULTIVITAMIN) capsule Take 1 capsule by mouth daily.      Marland Kitchen SYNTHROID 112 MCG tablet Take 1 tablet every other day, alternating with 125 mcg. 60 tablet 3  . SYNTHROID 125 MCG tablet Take 1 tablet every other day, alternating with 112 mcg. 60 tablet 3  . VENTOLIN HFA 108 (90 BASE) MCG/ACT inhaler Ad lib.    . vitamin C (ASCORBIC  ACID) 500 MG tablet Take 500 mg by mouth daily.     No current facility-administered medications on file prior to visit.    No Known Allergies   Family History  Problem Relation Age of Onset  . Cancer Mother 74       BREAST   Also, - Diabetes in paternal grandmother - Hypertension in heart disease in maternal grandmother  PE: BP 120/70 (BP Location: Left Arm, Patient Position: Sitting)   Pulse 76   Wt 169 lb (76.7 kg)   LMP 08/08/2016   SpO2 98%   BMI 26.47 kg/m  Wt Readings from Last 3 Encounters:  08/18/16 169 lb (76.7 kg)  01/03/16 163 lb 3.2 oz (74 kg)  10/04/15 164 lb (74.4 kg)   Constitutional: overweight, in NAD Eyes: PERRLA, EOMI, no exophthalmos ENT: moist mucous membranes, no thyromegaly, no cervical lymphadenopathy Cardiovascular: RRR, No MRG Respiratory: CTA B Gastrointestinal: abdomen soft, NT, ND, BS+ Musculoskeletal: no deformities, strength intact in all 4 Skin: moist, warm, no rashes Neurological: no tremor with outstretched hands, DTR normal in all 4  ASSESSMENT: 1. Post ablative Hypothyroidism - Developed in 2011, after having had RAI treatment for Graves' disease in 2009  PLAN:  1. Patient with long-standing  post ablative hypothyroidism, Previously on generic levothyroxine, then switched to Synthroid d.a.w. with significant improvement in her symptoms: Less fatigue, joint pains, headaches and hair loss. She continues on Synthroid 125 alternating with 112 g every other day.  - she feels good on this dose, only slightly more fatigued lately - latest thyroid labs reviewed with pt >> normal  - we discussed about taking the thyroid hormone every day, with water, >30 minutes before breakfast, separated by >4 hours from acid reflux medications, calcium, iron, multivitamins. Pt. is taking it correctly - will check thyroid tests today: TSH and fT4 - If labs are abnormal, she will need to return for repeat TFTs in 1.5 months - OTW, RTC in 1 year  Office  Visit on 08/18/2016  Component Date Value Ref Range Status  . Free T4 08/18/2016 1.24  0.60 - 1.60 ng/dL Final   Comment: Specimens from patients who are undergoing biotin therapy and /or ingesting biotin supplements may contain high levels of biotin.  The higher biotin concentration in these specimens interferes with this Free T4 assay.  Specimens that contain high levels  of biotin may cause false high results for this Free T4 assay.  Please interpret results in light of the total clinical presentation of the patient.    Marland Kitchen TSH 08/18/2016 0.88  0.35 - 4.50 uIU/mL Final   Normal TFTs.  Philemon Kingdom, MD PhD [facility] Endocrinology

## 2016-08-19 ENCOUNTER — Ambulatory Visit
Admission: RE | Admit: 2016-08-19 | Discharge: 2016-08-19 | Disposition: A | Discharge status: Home / Self Care | Payer: BC Managed Care – PPO | Source: Ambulatory Visit | Attending: Family Medicine | Admitting: Family Medicine

## 2016-08-19 DIAGNOSIS — Z1231 Encounter for screening mammogram for malignant neoplasm of breast: Secondary | ICD-10-CM

## 2017-04-30 ENCOUNTER — Other Ambulatory Visit: Payer: Self-pay | Admitting: Internal Medicine

## 2017-07-09 ENCOUNTER — Other Ambulatory Visit: Payer: Self-pay | Admitting: Family Medicine

## 2017-07-09 DIAGNOSIS — Z1231 Encounter for screening mammogram for malignant neoplasm of breast: Secondary | ICD-10-CM

## 2017-08-11 ENCOUNTER — Encounter: Payer: Self-pay | Admitting: Internal Medicine

## 2017-08-11 ENCOUNTER — Ambulatory Visit: Payer: BC Managed Care – PPO | Admitting: Internal Medicine

## 2017-08-11 VITALS — BP 110/70 | HR 81 | Ht 66.93 in | Wt 168.8 lb

## 2017-08-11 DIAGNOSIS — E890 Postprocedural hypothyroidism: Secondary | ICD-10-CM

## 2017-08-11 LAB — T4, FREE: FREE T4: 1.31 ng/dL (ref 0.60–1.60)

## 2017-08-11 LAB — TSH: TSH: 1.21 u[IU]/mL (ref 0.35–4.50)

## 2017-08-11 MED ORDER — SYNTHROID 125 MCG PO TABS: ORAL_TABLET | ORAL | 1 refills | Status: DC

## 2017-08-11 NOTE — Patient Instructions (Signed)
Please continue alternating Synthroid 112 with 125 mcg every other day.  Please stop at the lab.  Please come back for a follow-up appointment in 1 year. Marland Kitchen

## 2017-08-11 NOTE — Progress Notes (Signed)
Patient ID: Carmen Greene, female   DOB: 1975/08/24, 42 y.o.   MRN: [id number]   HPI  Carmen Greene is a 42 y.o.-year-old female, initially referred by her PCP, Dr. Carlena Sax, returning for follow-up for hypothyroidism. Last visit 1 year ago.  Reviewed history: Pt. 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 In 09/2015, we changed to brand name Synthroid.  She continues to feel much better on Synthroid brand name compared to levothyroxine generic.  Pt is on Synthroid 125 alternating with 112 mcg every other day, taken: - in am - fasting - at least 30 min from b'fast - no Fe, PPIs - + Ca, MVIs - at night - + on Biotin - stopped MVI + Biotin 2 weeks ago  I reviewed pt's TFTs: Lab Results  Component Value Date   TSH 0.88 08/18/2016   TSH 1.56 01/03/2016   TSH 1.45 10/04/2015   FREET4 1.24 08/18/2016   FREET4 1.36 01/03/2016   FREET4 1.34 10/04/2015   Prev: 07/31/2105: TSH 2.52 - alternating the 112 with 125 mcg qod LT4 10/31/2014: TSH 0.47 - alternating the 112 with 125 mcg qod LT4 09/14/2014: TSH 0.442 - on 125 mcg daily ... 01/18/2013: TSH 2.56 12/05/2012: TSH 6.88 07/18/2012: TSH 1.29 12/25/2011: TSH 2.45 11/23/2011: TSH 36.45, free T4 4.6 >> started LT4 08/12/2010: TSH 4.93, free T4 0.91 09/30/2009: TSH 6.26, normal free t4  She feels best when her TSH is around 1-1.5.  She initially described weight gain of 60 pounds after her hypothyroidism diagnosis, however, she then lost 45 pounds after she started exercising 06/2014.  Pt denies: - feeling nodules in neck - hoarseness - dysphagia - choking - SOB with lying down  She has no FH of thyroid disorders. No FH of thyroid cancer. No h/o radiation tx to head or neck.  No seaweed or kelp. No recent contrast studies. No herbal supplements. No recent steroids use.   ROS: Constitutional: no weight  gain/no weight loss, no fatigue, no subjective hyperthermia, no subjective hypothermia Eyes: no blurry vision, no xerophthalmia ENT: no sore throat, + see HPI Cardiovascular: no CP/no SOB/no palpitations/no leg swelling Respiratory: no cough/no SOB/no wheezing Gastrointestinal: no N/no V/no D/no C/no acid reflux Musculoskeletal: no muscle aches/no joint aches Skin: no rashes, no hair loss, + hirsutism on chin Neurological: no tremors/no numbness/no tingling/no dizziness + migraines  -menstrual + menses closer together  I reviewed pt's medications, allergies, PMH, social hx, family hx, and changes were documented in the history of present illness. Otherwise, unchanged from my initial visit note.  Past Medical History:  Diagnosis Date  . Cyst of breast, diffuse fibrocystic    right  . Cyst of breast, left, diffuse fibrocystic   . Lesion of breast    left complex sclerosing  . Thyroid disease 2009   Graves disease- borderline hypo   Past Surgical History:  Procedure Laterality Date  . BREAST SURGERY  09/24/10   lft- benign sclerosing lesion  . TUBAL LIGATION  2000   hulka clips   Social History   Social History  . Marital status: Married    Spouse name: N/A  . 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 Smoking status: Former Research scientist (life sciences)  . Smokeless tobacco: Not on file     Comment: quit  in 2012  . Alcohol use 2 drinks once a week: Wine, beer, liquor    . Drug use: No   Current Outpatient Medications on File Prior to Visit  Medication Sig Dispense Refill  . ALPRAZolam (XANAX) 0.25 MG tablet Ad lib.    . Multiple Vitamin (MULTIVITAMIN) capsule Take 1 capsule by mouth daily.      Marland Kitchen SYNTHROID 112 MCG tablet Take 1 tablet every other day, alternating with 125 mcg. 60 tablet 3  . SYNTHROID 112 MCG tablet TAKE 1 TABLET EVERY OTHER DAY, ALTERNATING WITH 125 MCG. 60 tablet 1  . SYNTHROID 125 MCG tablet  Take 1 tablet every other day, alternating with 112 mcg. 60 tablet 3  . SYNTHROID 125 MCG tablet TAKE 1 TABLET EVERY OTHER DAY, ALTERNATING WITH 112 MCG. 60 tablet 1  . VENTOLIN HFA 108 (90 BASE) MCG/ACT inhaler Ad lib.    . vitamin C (ASCORBIC ACID) 500 MG tablet Take 500 mg by mouth daily.     No current facility-administered medications on file prior to visit.    No Known Allergies   Family History  Problem Relation Age of Onset  . Cancer Mother 78       BREAST   Also, - Diabetes in paternal grandmother - Hypertension in heart disease in maternal grandmother  PE: BP 110/70   Pulse 81   Ht 5' 6.93" (1.7 m)   Wt 168 lb 12.8 oz (76.6 kg)   LMP 08/04/2017   SpO2 99%   BMI 26.49 kg/m  Wt Readings from Last 3 Encounters:  08/11/17 168 lb 12.8 oz (76.6 kg)  08/18/16 169 lb (76.7 kg)  01/03/16 163 lb 3.2 oz (74 kg)   Constitutional: slightly overweight, in NAD Eyes: PERRLA, EOMI, no exophthalmos ENT: moist mucous membranes, no thyromegaly, no cervical lymphadenopathy Cardiovascular: RRR, No MRG Respiratory: CTA B Gastrointestinal: abdomen soft, NT, ND, BS+ Musculoskeletal: no deformities, strength intact in all 4 Skin: moist, warm, no rashes Neurological: no tremor with outstretched hands, DTR normal in all 4  ASSESSMENT: 1. Post ablative Hypothyroidism - Developed in 2011, after having had RAI treatment for Graves' disease in 2009  PLAN:  1. Patient with long-standing, post ablative hypothyroidism, previously on generic levothyroxine, now on Synthroid d.a.w. with significant improvement in her symptoms.  She has less fatigue, less joint pains, headaches, and hair loss.   - She continues on Synthroid 125 alternating with 112 mcg daily - latest thyroid labs reviewed with pt >> normal  - pt feels good on this dose. - we discussed about taking the thyroid hormone every day, with water, >30 minutes before breakfast, separated by >4 hours from acid reflux medications,  calcium, iron, multivitamins. Pt. is taking it correctly. - will check thyroid tests today: TSH and fT4 - If labs are abnormal, she will need to return for repeat TFTs in 1.5 months  - time spent with the patient: 15 min, of which >50% was spent in obtaining information about her symptoms, reviewing her previous labs, evaluations, and treatments, counseling her about her condition (please see the discussed topics above), and developing a plan to further investigate and treat it.  Philemon Kingdom, MD PhD [facility] Endocrinology

## 2017-08-23 ENCOUNTER — Ambulatory Visit
Admission: RE | Admit: 2017-08-23 | Discharge: 2017-08-23 | Disposition: A | Discharge status: Home / Self Care | Payer: BC Managed Care – PPO | Source: Ambulatory Visit | Attending: Family Medicine | Admitting: Family Medicine

## 2017-08-23 DIAGNOSIS — Z1231 Encounter for screening mammogram for malignant neoplasm of breast: Secondary | ICD-10-CM

## 2017-12-17 ENCOUNTER — Other Ambulatory Visit: Payer: Self-pay | Admitting: Internal Medicine

## 2018-04-28 ENCOUNTER — Ambulatory Visit (INDEPENDENT_AMBULATORY_CARE_PROVIDER_SITE_OTHER): Payer: BC Managed Care – PPO | Admitting: Internal Medicine

## 2018-04-28 ENCOUNTER — Other Ambulatory Visit: Payer: Self-pay

## 2018-04-28 DIAGNOSIS — E890 Postprocedural hypothyroidism: Secondary | ICD-10-CM

## 2018-04-28 NOTE — Patient Instructions (Addendum)
Please continue alternating Synthroid 112 with 125 mcg every other day.  Please ome back for labs tomorrow  Please come back for a follow-up appointment in 3 to 6 months. Carmen Greene

## 2018-04-28 NOTE — Progress Notes (Signed)
Patient ID: Avaleen Greene, female   DOB: 26-Apr-1975, 43 y.o.   MRN: [id number]  Patient location: Home My location: Office  Referring Provider: London Pepper, MD  I connected with the patient on 04/28/18 at 9:55 AM EDT by a video enabled telemedicine application and verified that I am speaking with the correct person.   I discussed the limitations of evaluation and management by telemedicine and the availability of in person appointments. The patient expressed understanding and agreed to proceed.   Details of the encounter are shown below.  HPI  Carmen Greene is a 43 y.o.-year-old female, initially referred by her PCP, Dr. Carlena Sax, presenting for follow-up for hypothyroidism. Last visit 9 mo ago.  In last 3 mo: fingernails brittle, some wt gain, forgetfulness, irregular menses.  In last 2 months: insomnia, mood swings, + wt gain - 10 lbs in last 3 mo, had 1 panic attack last week (she had not have one in a long time before).  Reviewed and addended history: Pt. 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  Will change to Synthroid brand name in 09/2015. She felt better after the change.  Pt is on Synthroid 125 alternating with 112 mcg every other day, taken: - in am - fasting - at least 1h from b'fast - no Fe, PPIs - + Ca, MVI - at night - on Biotin in MVI -she is aware that she needs to stop this prior to thyroid labs  Reviewed patient's TFTs: Lab Results  Component Value Date   TSH 1.21 08/11/2017   TSH 0.88 08/18/2016   TSH 1.56 01/03/2016   TSH 1.45 10/04/2015   FREET4 1.31 08/11/2017   FREET4 1.24 08/18/2016   FREET4 1.36 01/03/2016   FREET4 1.34 10/04/2015   Prev: 07/31/2105: TSH 2.52 - alternating the 112 with 125 mcg qod LT4 10/31/2014: TSH 0.47 - alternating the 112 with 125 mcg qod LT4 09/14/2014: TSH 0.442 - on 125 mcg daily ... 01/18/2013:  TSH 2.56 12/05/2012: TSH 6.88 07/18/2012: TSH 1.29 12/25/2011: TSH 2.45 11/23/2011: TSH 36.45, free T4 4.6 >> started LT4 08/12/2010: TSH 4.93, free T4 0.91 09/30/2009: TSH 6.26, normal free t4  She feels best when her TSH is around 1-1 0.5.  She initially described weight gain of 60 pounds after her hypothyroidism diagnosis, however, she then lost 45 pounds after she started exercising 06/2014.  Pt denies: - feeling nodules in neck - hoarseness - dysphagia - choking - SOB with lying down  She has no FH of thyroid disorders. No FH of thyroid cancer. No h/o radiation tx to head or neck.  No herbal supplements.  No recent steroids use.   ROS: Constitutional: + weight gain/no weight loss, + fatigue, + + insomnia, no subjective hyperthermia, + subjective hypothermia Eyes: no blurry vision, no xerophthalmia ENT: no sore throat, + see HPI Cardiovascular: no CP/no SOB/no palpitations/no leg swelling Respiratory: no cough/no SOB/no wheezing Gastrointestinal: no N/no V/no D/no C/no acid reflux Musculoskeletal: no muscle aches/+ joint aches Skin: no rashes, no  hair loss Neurological: no tremors/no numbness/no tingling/no dizziness  I reviewed pt's medications, allergies, PMH, social hx, family hx, and changes were documented in the history of present illness. Otherwise, unchanged from my initial visit note.  Past Medical History:  Diagnosis Date  . Cyst of breast, diffuse fibrocystic    right  . Cyst of breast, left, diffuse  fibrocystic   . Lesion of breast    left complex sclerosing  . Thyroid disease 2009   Graves disease- borderline hypo   Past Surgical History:  Procedure Laterality Date  . BREAST EXCISIONAL BIOPSY Left   . BREAST SURGERY  09/24/10   lft- benign sclerosing lesion  . TUBAL LIGATION  2000   hulka clips   Social History   Social History  . Marital status: Married    Spouse name: N/A  . Number of children: 3   Occupational History  . Teacher  assistant - [employer]    Social History Main Topics  . Smoking status: Former Research scientist (life sciences)  . Smokeless tobacco: Not on file     Comment: quit in 2012  . Alcohol use 2 drinks once a week: Wine, beer, liquor    . Drug use: No   Current Outpatient Medications on File Prior to Visit  Medication Sig Dispense Refill  . ALPRAZolam (XANAX) 0.25 MG tablet Ad lib.    . Multiple Vitamin (MULTIVITAMIN) capsule Take 1 capsule by mouth daily.      Marland Kitchen SYNTHROID 112 MCG tablet TAKE 1 TABLET EVERY OTHER DAY, ALTERNATING WITH 125 MCG. 60 tablet 1  . SYNTHROID 125 MCG tablet TAKE 1 TABLET EVERY OTHER DAY, ALTERNATING WITH 112 MCG. 60 tablet 1  . SYNTHROID 125 MCG tablet TAKE 1 TABLET EVERY OTHER DAY, ALTERNATING WITH 112 MCG. 60 tablet 1  . vitamin C (ASCORBIC ACID) 500 MG tablet Take 500 mg by mouth daily.     No current facility-administered medications on file prior to visit.    No Known Allergies   Family History  Problem Relation Age of Onset  . Cancer Mother 52       BREAST   Also, - Diabetes in paternal grandmother - Hypertension in heart disease in maternal grandmother  PE: There were no vitals taken for this visit. Wt Readings from Last 3 Encounters:  08/11/17 168 lb 12.8 oz (76.6 kg)  08/18/16 169 lb (76.7 kg)  01/03/16 163 lb 3.2 oz (74 kg)   Constitutional:  in NAD  The physical exam was not performed (virtual visit).  ASSESSMENT: 1. Post ablative Hypothyroidism - Developed in 2011, after RAI treatment for Graves' disease in 2009  PLAN:  1. Patient with longstanding, post ablative hypothyroidism, previously on generic levothyroxine, then on Synthroid d.a.w., with significant improvement in her symptoms after changing the formulation.  She started to have less fatigue, less joint pains, headaches, and hair loss. -She continues on Synthroid 125 alternating with 112 mcg every other day - pt describes that approximately 3 months ago, she started to feel poorly and this  exacerbated in the last 2 months (see symptoms in the HPI).  She feels that maybe her thyroid may be out of balance. - latest thyroid labs reviewed with pt >> normal  - we discussed about taking the thyroid hormone every day, with water, >30 minutes before breakfast, separated by >4 hours from acid reflux medications, calcium, iron, multivitamins. Pt. is taking it correctly. - will check thyroid tests tomorrow: TSH and fT4.  We discussed that if TFTs are abnormal, we will adjust the levothyroxine dose.  However, if they are normal, we will try to add a low-dose liothyronine to see if her symptoms improve.  If we do so, she will need to come back for labs in  1.5 months -Otherwise, I will see her in 3 months  Orders Placed This Encounter  Procedures  .  TSH  . T4, free   - time spent with the patient: 15 min, of which >50% was spent in obtaining information about her symptoms, reviewing her previous labs, evaluations, and treatments, counseling her about her condition (please see the discussed topics above), and developing a plan to further investigate and treat it.  Component     Latest Ref Rng & Units 04/29/2018  T4,Free(Direct)     0.60 - 1.60 ng/dL 1.17  TSH     0.35 - 4.50 uIU/mL 2.66  Thyroid tests are normal.  We will add 2.5 mg of Cytomel and decrease the dose of Synthroid to 112 mcg daily and repeat her TFTs in 1.5 months.  Philemon Kingdom, MD PhD [facility] Endocrinology

## 2018-04-29 ENCOUNTER — Other Ambulatory Visit: Payer: Self-pay | Admitting: Internal Medicine

## 2018-04-29 ENCOUNTER — Other Ambulatory Visit (INDEPENDENT_AMBULATORY_CARE_PROVIDER_SITE_OTHER): Payer: BC Managed Care – PPO

## 2018-04-29 ENCOUNTER — Encounter: Payer: Self-pay | Admitting: Internal Medicine

## 2018-04-29 ENCOUNTER — Other Ambulatory Visit: Payer: Self-pay

## 2018-04-29 DIAGNOSIS — E890 Postprocedural hypothyroidism: Secondary | ICD-10-CM | POA: Diagnosis not present

## 2018-04-29 LAB — T4, FREE: Free T4: 1.17 ng/dL (ref 0.60–1.60)

## 2018-04-29 LAB — TSH: TSH: 2.66 u[IU]/mL (ref 0.35–4.50)

## 2018-04-29 MED ORDER — LIOTHYRONINE SODIUM 5 MCG PO TABS: 2.5000 ug | ORAL_TABLET | Freq: Every day | ORAL | 5 refills | Status: DC

## 2018-04-29 MED ORDER — SYNTHROID 125 MCG PO TABS: 125.0000 ug | ORAL_TABLET | Freq: Every day | ORAL | 2 refills | Status: DC

## 2018-06-17 ENCOUNTER — Other Ambulatory Visit: Payer: Self-pay

## 2018-06-17 ENCOUNTER — Other Ambulatory Visit (INDEPENDENT_AMBULATORY_CARE_PROVIDER_SITE_OTHER): Payer: BC Managed Care – PPO

## 2018-06-17 DIAGNOSIS — E890 Postprocedural hypothyroidism: Secondary | ICD-10-CM

## 2018-06-17 LAB — T3, FREE: T3, Free: 2.8 pg/mL (ref 2.3–4.2)

## 2018-06-17 LAB — TSH: TSH: 0.66 u[IU]/mL (ref 0.35–4.50)

## 2018-06-17 LAB — T4, FREE: Free T4: 1.48 ng/dL (ref 0.60–1.60)

## 2018-07-20 ENCOUNTER — Other Ambulatory Visit: Payer: Self-pay | Admitting: Family Medicine

## 2018-07-20 ENCOUNTER — Telehealth: Payer: Self-pay | Admitting: Internal Medicine

## 2018-07-20 DIAGNOSIS — Z1231 Encounter for screening mammogram for malignant neoplasm of breast: Secondary | ICD-10-CM

## 2018-07-20 NOTE — Telephone Encounter (Signed)
Yes, okay to postpone.  Reviewed the latest TFTs and they are normal.

## 2018-07-20 NOTE — Telephone Encounter (Signed)
Patient called and asked, if based on her April 16 virtual visit and her labs done 04/17 and 06/05, could she postpone her July 24th appointment until November 2020?  Would like to discuss postponement if possible?  Call back number is [phone]

## 2018-08-05 ENCOUNTER — Ambulatory Visit: Payer: BC Managed Care – PPO | Admitting: Internal Medicine

## 2018-08-09 ENCOUNTER — Other Ambulatory Visit: Payer: Self-pay | Admitting: Internal Medicine

## 2018-08-09 ENCOUNTER — Other Ambulatory Visit: Payer: Self-pay | Admitting: Family Medicine

## 2018-08-09 ENCOUNTER — Other Ambulatory Visit (HOSPITAL_COMMUNITY)
Admission: RE | Admit: 2018-08-09 | Discharge: 2018-08-09 | Disposition: A | Discharge status: Home / Self Care | Payer: BC Managed Care – PPO | Source: Ambulatory Visit | Attending: Family Medicine | Admitting: Family Medicine

## 2018-08-09 DIAGNOSIS — Z01411 Encounter for gynecological examination (general) (routine) with abnormal findings: Secondary | ICD-10-CM | POA: Insufficient documentation

## 2018-08-10 ENCOUNTER — Other Ambulatory Visit: Payer: Self-pay | Admitting: Family Medicine

## 2018-08-10 DIAGNOSIS — N6310 Unspecified lump in the right breast, unspecified quadrant: Secondary | ICD-10-CM

## 2018-08-12 LAB — CYTOLOGY - PAP
Diagnosis: NEGATIVE
HPV: NOT DETECTED

## 2018-08-25 ENCOUNTER — Other Ambulatory Visit: Payer: BC Managed Care – PPO

## 2018-08-26 ENCOUNTER — Ambulatory Visit
Admission: RE | Admit: 2018-08-26 | Discharge: 2018-08-26 | Disposition: A | Discharge status: Home / Self Care | Payer: BC Managed Care – PPO | Source: Ambulatory Visit | Attending: Family Medicine | Admitting: Family Medicine

## 2018-08-26 ENCOUNTER — Other Ambulatory Visit: Payer: Self-pay

## 2018-08-26 DIAGNOSIS — N6310 Unspecified lump in the right breast, unspecified quadrant: Secondary | ICD-10-CM

## 2018-08-29 ENCOUNTER — Encounter: Payer: Self-pay | Admitting: Internal Medicine

## 2018-08-29 NOTE — Progress Notes (Signed)
Recent TSH level received from PCP (drawn 08/09/2018): TSH 1.15, normal

## 2018-08-31 ENCOUNTER — Ambulatory Visit: Payer: BC Managed Care – PPO

## 2018-11-04 ENCOUNTER — Other Ambulatory Visit: Payer: Self-pay | Admitting: Internal Medicine

## 2018-11-30 ENCOUNTER — Other Ambulatory Visit: Payer: Self-pay

## 2018-12-02 ENCOUNTER — Encounter: Payer: Self-pay | Admitting: Internal Medicine

## 2018-12-02 ENCOUNTER — Other Ambulatory Visit: Payer: Self-pay

## 2018-12-02 ENCOUNTER — Ambulatory Visit: Payer: BC Managed Care – PPO | Admitting: Internal Medicine

## 2018-12-02 VITALS — BP 120/70 | HR 71 | Ht 66.93 in | Wt 189.0 lb

## 2018-12-02 DIAGNOSIS — E890 Postprocedural hypothyroidism: Secondary | ICD-10-CM | POA: Diagnosis not present

## 2018-12-02 NOTE — Progress Notes (Signed)
Patient ID: Carmen Greene, female   DOB: November 13, 1975, 43 y.o.   MRN: [id number]  This visit occurred during the SARS-CoV-2 public health emergency.  Safety protocols were in place, including screening questions prior to the visit, additional usage of staff PPE, and extensive cleaning of exam room while observing appropriate contact time as indicated for disinfecting solutions.   HPI  Carmen Greene is a 43 y.o.-year-old female, initially referred by her PCP, Dr. Carlena Sax, presenting for follow-up for hypothyroidism. Last visit 7 months ago (virtual).  At last visit, she complained of: Brittle fingernails, some weight gain, forgetfulness, irregular menses.  She also experienced insomnia and mood swings and even had 1 panic attack.  At that time, her TSH was higher than her target and we increased her Synthroid slightly.  She felt better afterwards the the above symptoms resolved or improved significantly.  Reviewed history: Pt. 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  Will change to Synthroid brand name in 09/2015.  She felt better after the change.  She is on Synthroid 125 mcg daily: - in am - fasting - at least 30 min from b'fast - + Ca and multivitamins at night - no Fe, PPIs - + on Biotin 1000 in MVI - off for 2 days  Reviewed her TFTs: TSH level received from PCP (drawn 08/09/2018): TSH 1.15, normal Lab Results  Component Value Date   TSH 0.66 06/17/2018   TSH 2.66 04/29/2018   TSH 1.21 08/11/2017   TSH 0.88 08/18/2016   TSH 1.56 01/03/2016   TSH 1.45 10/04/2015   FREET4 1.48 06/17/2018   FREET4 1.17 04/29/2018   FREET4 1.31 08/11/2017   FREET4 1.24 08/18/2016   FREET4 1.36 01/03/2016   FREET4 1.34 10/04/2015   Prev: 07/31/2105: TSH 2.52 - alternating the 112 with 125 mcg qod LT4 10/31/2014: TSH 0.47 - alternating the 112 with 125 mcg qod LT4 09/14/2014:  TSH 0.442 - on 125 mcg daily ... 01/18/2013: TSH 2.56 12/05/2012: TSH 6.88 07/18/2012: TSH 1.29 12/25/2011: TSH 2.45 11/23/2011: TSH 36.45, free T4 4.6 >> started LT4 08/12/2010: TSH 4.93, free T4 0.91 09/30/2009: TSH 6.26, normal free t4  She feels her best when her TSH is at 1-1.5.  She initially described weight gain of 60 pounds after her hypothyroidism diagnosis, however, she then lost 45 pounds after she started exercising 06/2014.  Pt denies: - feeling nodules in neck - hoarseness - dysphagia - choking - SOB with lying down  She has no FH of thyroid disorders. No FH of thyroid cancer. No h/o radiation tx to head or neck.  No herbal supplements. No Biotin use. No recent steroids use.   She works in the [employer].  ROS: Constitutional: no recent weight gain/no weight loss, no fatigue, no subjective hyperthermia, no subjective hypothermia, no insomnia Eyes: no blurry vision, no xerophthalmia ENT: no sore throat, + see HPI Cardiovascular: no CP/no SOB/no palpitations/no leg swelling Respiratory: no cough/no SOB/no wheezing Gastrointestinal: no N/no V/no D/no C/no acid reflux Musculoskeletal: no muscle aches/joint aches Skin: no rashes, no hair loss Neurological: no tremors/no numbness/no tingling/no dizziness, + migraines  I reviewed pt's medications, allergies, PMH, social hx, family hx, and changes were documented in the history of present illness. Otherwise, unchanged from my initial visit note.  Past Medical History:  Diagnosis Date  . Cyst of breast, diffuse fibrocystic  right  . Cyst of breast, left, diffuse fibrocystic   . Lesion of breast    left complex sclerosing  . Thyroid disease 2009   Graves disease- borderline hypo   Past Surgical History:  Procedure Laterality Date  . BREAST EXCISIONAL BIOPSY Left   . BREAST SURGERY  09/24/10   lft- benign sclerosing lesion  . TUBAL LIGATION  2000   hulka clips   Social History    Social History  . Marital status: Married    Spouse name: N/A  . 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 Smoking status: Former Research scientist (life sciences)  . Smokeless tobacco: Not on file     Comment: quit in 2012  . Alcohol use 2 drinks once a week: Wine, beer, liquor    . Drug use: No   Current Outpatient Medications on File Prior to Visit  Medication Sig Dispense Refill  . ALPRAZolam (XANAX) 0.25 MG tablet Ad lib.    . Multiple Vitamin (MULTIVITAMIN) capsule Take 1 capsule by mouth daily.      Marland Kitchen SYNTHROID 125 MCG tablet TAKE 1 TABLET BY MOUTH DAILY BEFORE BREAKFAST. 30 tablet 2  . vitamin C (ASCORBIC ACID) 500 MG tablet Take 500 mg by mouth daily.     No current facility-administered medications on file prior to visit.    No Known Allergies   Family History  Problem Relation Age of Onset  . Cancer Mother 48       BREAST  . Breast cancer Mother 40   Also, - Diabetes in paternal grandmother - Hypertension in heart disease in maternal grandmother  PE: BP 120/70   Pulse 71   Ht 5' 6.93" (1.7 m)   Wt 189 lb (85.7 kg)   SpO2 98%   BMI 29.66 kg/m  Wt Readings from Last 3 Encounters:  12/02/18 189 lb (85.7 kg)  08/11/17 168 lb 12.8 oz (76.6 kg)  08/18/16 169 lb (76.7 kg)   Constitutional: overweight, in NAD Eyes: PERRLA, EOMI, no exophthalmos ENT: moist mucous membranes, no thyromegaly, no cervical lymphadenopathy Cardiovascular: RRR, No MRG Respiratory: CTA B Gastrointestinal: abdomen soft, NT, ND, BS+ Musculoskeletal: no deformities, strength intact in all 4 Skin: moist, warm, no rashes Neurological: no tremor with outstretched hands, DTR normal in all 4  ASSESSMENT: 1. Post ablative Hypothyroidism - Developed in 2011, after RAI treatment for Graves' disease in 2009  PLAN:  1. Patient with longstanding, post ablative hypothyroidism, previously on generic levothyroxine, then on Synthroid  d.a.w., with significant improvement in her symptoms after changing the formulation.  She started to have less fatigue, her joint pains improved, and her hair loss and headaches also improved.  At last visit she was on Synthroid 125 alternating with 112 mcg every other day.  We discussed at that time that we could add Cytomel, if she preferred, however, her TSH was higher in the normal range and she opted to switch to a regimen containing only Synthroid 125 mcg daily.   - Repeat TSH after increasing the dose was reviewed with the patient: This was normal as checked by PCP in 07/2018.  - she continues on Synthroid 125 mcg daily - She tells me that she started to feel much better after she increase the dose of Synthroid.  She is not sleeping well, does not have any more mood swings, and her menstrual cycles are regular.  She also feels that her  weight gain has stopped.  She would like to continue the above dose. - we discussed about taking the thyroid hormone every day, with water, >30 minutes before breakfast, separated by >4 hours from acid reflux medications, calcium, iron, multivitamins. Pt. is taking it correctly. - will check thyroid tests today: TSH and fT4 - If labs are abnormal, she will need to return for repeat TFTs in 1.5 months -I will see her back in 1 year for a visit in 6 months for labs   - time spent with the patient: 15 minutes, of which >50% was spent in obtaining information about her symptoms, reviewing her previous labs, evaluations, and treatments, counseling her about her condition (please see the discussed topics above), and developing a plan to further investigate and treat it; she had a number of questions which I addressed.  Needs refills x3 mo.  Office Visit on 12/02/2018  Component Date Value Ref Range Status  . Free T4 12/02/2018 1.4  0.8 - 1.8 ng/dL Final  . TSH 12/02/2018 2.01  mIU/L Final   Comment:           Reference Range .           > or = 20 Years   0.40-4.50 .                Pregnancy Ranges           First trimester    0.26-2.66           Second trimester   0.55-2.73           Third trimester    0.43-2.91      Philemon Kingdom, MD PhD [facility] Endocrinology

## 2018-12-02 NOTE — Patient Instructions (Addendum)
Please continue Synthroid 125 mcg every day.  Take the thyroid hormone every day, with water, at least 30 minutes before breakfast, separated by at least 4 hours from: - acid reflux medications - calcium - iron - multivitamins  Please stop at the lab.  Please come back for a follow-up appointment in 1 year. 

## 2018-12-03 LAB — T4, FREE: Free T4: 1.4 ng/dL (ref 0.8–1.8)

## 2018-12-03 LAB — TSH: TSH: 2.01 mIU/L

## 2019-01-11 ENCOUNTER — Ambulatory Visit: Payer: BC Managed Care – PPO | Attending: Internal Medicine

## 2019-01-11 DIAGNOSIS — Z20822 Contact with and (suspected) exposure to covid-19: Secondary | ICD-10-CM

## 2019-01-12 LAB — NOVEL CORONAVIRUS, NAA: SARS-CoV-2, NAA: NOT DETECTED

## 2019-02-06 ENCOUNTER — Other Ambulatory Visit: Payer: Self-pay | Admitting: Internal Medicine

## 2019-05-09 ENCOUNTER — Other Ambulatory Visit: Payer: Self-pay | Admitting: Internal Medicine

## 2019-05-29 ENCOUNTER — Encounter: Payer: Self-pay | Admitting: Internal Medicine

## 2019-05-31 ENCOUNTER — Other Ambulatory Visit: Payer: Self-pay

## 2019-05-31 ENCOUNTER — Other Ambulatory Visit: Payer: Self-pay | Admitting: Internal Medicine

## 2019-05-31 ENCOUNTER — Other Ambulatory Visit (INDEPENDENT_AMBULATORY_CARE_PROVIDER_SITE_OTHER): Payer: BC Managed Care – PPO

## 2019-05-31 DIAGNOSIS — E890 Postprocedural hypothyroidism: Secondary | ICD-10-CM

## 2019-05-31 LAB — TSH: TSH: 0.83 u[IU]/mL (ref 0.35–4.50)

## 2019-05-31 LAB — T4, FREE: Free T4: 1.2 ng/dL (ref 0.60–1.60)

## 2019-06-01 ENCOUNTER — Other Ambulatory Visit: Payer: BC Managed Care – PPO

## 2019-08-07 ENCOUNTER — Other Ambulatory Visit: Payer: Self-pay | Admitting: Internal Medicine

## 2019-08-07 ENCOUNTER — Other Ambulatory Visit: Payer: Self-pay | Admitting: Family Medicine

## 2019-08-07 DIAGNOSIS — Z1231 Encounter for screening mammogram for malignant neoplasm of breast: Secondary | ICD-10-CM

## 2019-08-28 ENCOUNTER — Ambulatory Visit
Admission: RE | Admit: 2019-08-28 | Discharge: 2019-08-28 | Disposition: A | Discharge status: Home / Self Care | Payer: BC Managed Care – PPO | Source: Ambulatory Visit

## 2019-08-28 ENCOUNTER — Other Ambulatory Visit: Payer: Self-pay

## 2019-08-28 DIAGNOSIS — Z1231 Encounter for screening mammogram for malignant neoplasm of breast: Secondary | ICD-10-CM

## 2019-11-03 ENCOUNTER — Other Ambulatory Visit: Payer: Self-pay | Admitting: Internal Medicine

## 2019-12-05 ENCOUNTER — Encounter: Payer: Self-pay | Admitting: Internal Medicine

## 2019-12-05 ENCOUNTER — Other Ambulatory Visit: Payer: Self-pay

## 2019-12-05 ENCOUNTER — Ambulatory Visit: Payer: BC Managed Care – PPO | Admitting: Internal Medicine

## 2019-12-05 VITALS — BP 110/84 | HR 79 | Ht 67.0 in | Wt 193.2 lb

## 2019-12-05 DIAGNOSIS — E890 Postprocedural hypothyroidism: Secondary | ICD-10-CM

## 2019-12-05 LAB — T4, FREE: Free T4: 1.47 ng/dL (ref 0.60–1.60)

## 2019-12-05 LAB — TSH: TSH: 0.8 u[IU]/mL (ref 0.35–4.50)

## 2019-12-05 NOTE — Progress Notes (Signed)
Patient ID: Carmen Greene, female   DOB: 1975-04-30, 44 y.o.   MRN: [id number]  This visit occurred during the SARS-CoV-2 public health emergency.  Safety protocols were in place, including screening questions prior to the visit, additional usage of staff PPE, and extensive cleaning of exam room while observing appropriate contact time as indicated for disinfecting solutions.   HPI  Carmen Greene is a 44 y.o.-year-old female, initially referred by her PCP, Dr. Carlena Sax, presenting for follow-up for hypothyroidism. Last visit 1 year ago (virtual)  Reviewed history: Pt. 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  Will change to Synthroid brand name in 09/2015.  She felt better after the change.  In 08/2018, she complained of: Brittle fingernails, some weight gain, forgetfulness, irregular menses.  She also experienced insomnia and mood swings and even had 1 panic attack.  At that time, her TSH was higher than her target and we increased her Synthroid slightly.  She felt better afterwards the the above symptoms resolved or improved significantly.  In 05/2019, she contacted me with increased fatigue, weight gain, decreased and insomnia.  TFTs were normal.  However, at this visit, she tells me that her symptoms may have been related to perimenopause: She has some hot flashes and also an occasionally irregular cycle.  Pt is on Synthroid 125 mcg daily, taken: - in am - fasting - at least 30 min from b'fast - + calcium at night - no iron - + multivitamins at night - no PPIs - + on Biotin 1000 mcg and multivitamins-off now for the last 2 days.  Reviewed her TFTs: Lab Results  Component Value Date   TSH 0.83 05/31/2019   TSH 2.01 12/02/2018   TSH 0.66 06/17/2018   TSH 2.66 04/29/2018   TSH 1.21 08/11/2017   TSH 0.88 08/18/2016   TSH 1.56 01/03/2016   TSH 1.45 10/04/2015    FREET4 1.20 05/31/2019   FREET4 1.4 12/02/2018   FREET4 1.48 06/17/2018   FREET4 1.17 04/29/2018   FREET4 1.31 08/11/2017   FREET4 1.24 08/18/2016   FREET4 1.36 01/03/2016   FREET4 1.34 10/04/2015  08/09/2018: TSH 1.15, normal  Prev: 07/31/2105: TSH 2.52 - alternating the 112 with 125 mcg qod LT4 10/31/2014: TSH 0.47 - alternating the 112 with 125 mcg qod LT4 09/14/2014: TSH 0.442 - on 125 mcg daily ... 01/18/2013: TSH 2.56 12/05/2012: TSH 6.88 07/18/2012: TSH 1.29 12/25/2011: TSH 2.45 11/23/2011: TSH 36.45, free T4 4.6 >> started LT4 08/12/2010: TSH 4.93, free T4 0.91 09/30/2009: TSH 6.26, normal free t4  She feels her best when her TSH is at 1-1.5.  She initially described weight gain of 60 pounds after her hypothyroid diagnosis, however, she then lost 45 pounds after she started exercising 06/2014.  Pt denies: - feeling nodules in neck - hoarseness - dysphagia - choking - SOB with lying down  No FH of thyroid disease or thyroid cancer. No h/o radiation tx to head or neck.  No seaweed or kelp. No recent contrast studies. No herbal supplements. No Biotin use. No recent steroids use.   She works in the [employer].  ROS: Constitutional: no weight gain/no weight loss, no fatigue, + subjective hyperthermia, no subjective hypothermia Eyes: no blurry vision, no xerophthalmia ENT: no sore throat, + see HPI Cardiovascular: no CP/no SOB/no palpitations/no leg swelling Respiratory: no cough/no SOB/no wheezing Gastrointestinal: no  N/no V/no D/no C/no acid reflux Musculoskeletal: no muscle aches/no joint aches Skin: no rashes, no hair loss Neurological: no tremors/no numbness/no tingling/no dizziness  I reviewed pt's medications, allergies, PMH, social hx, family hx, and changes were documented in the history of present illness. Otherwise, unchanged from my initial visit note.  Past Medical History:  Diagnosis Date  . Cyst of breast, diffuse fibrocystic     right  . Cyst of breast, left, diffuse fibrocystic   . Lesion of breast    left complex sclerosing  . Thyroid disease 2009   Graves disease- borderline hypo   Past Surgical History:  Procedure Laterality Date  . BREAST EXCISIONAL BIOPSY Left   . BREAST SURGERY  09/24/10   lft- benign sclerosing lesion  . TUBAL LIGATION  2000   hulka clips   Social History   Social History  . Marital status: Married    Spouse name: N/A  . 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 Smoking status: Former Research scientist (life sciences)  . Smokeless tobacco: Not on file     Comment: quit in 2012  . Alcohol use 2 drinks once a week: Wine, beer, liquor    . Drug use: No   Current Outpatient Medications on File Prior to Visit  Medication Sig Dispense Refill  . ALPRAZolam (XANAX) 0.25 MG tablet Ad lib.    . Multiple Vitamin (MULTIVITAMIN) capsule Take 1 capsule by mouth daily.      Marland Kitchen SYNTHROID 125 MCG tablet TAKE 1 TABLET BY MOUTH DAILY BEFORE BREAKFAST. 30 tablet 2  . vitamin C (ASCORBIC ACID) 500 MG tablet Take 500 mg by mouth daily.     No current facility-administered medications on file prior to visit.   No Known Allergies   Family History  Problem Relation Age of Onset  . Cancer Mother 79       BREAST  . Breast cancer Mother 64   Also, - Diabetes in paternal grandmother - Hypertension in heart disease in maternal grandmother  PE: BP 110/84   Pulse 80   Ht 5\' 7"  (1.702 m)   Wt 193 lb 3.2 oz (87.6 kg)   SpO2 98%   BMI 30.26 kg/m  Wt Readings from Last 3 Encounters:  12/05/19 193 lb 3.2 oz (87.6 kg)  12/02/18 189 lb (85.7 kg)  08/11/17 168 lb 12.8 oz (76.6 kg)   Constitutional: overweight, in NAD Eyes: PERRLA, EOMI, no exophthalmos ENT: moist mucous membranes, no thyromegaly, no cervical lymphadenopathy Cardiovascular: RRR, No MRG Respiratory: CTA B Gastrointestinal: abdomen soft, NT, ND, BS+ Musculoskeletal:  no deformities, strength intact in all 4 Skin: moist, warm, no rashes Neurological: no tremor with outstretched hands, DTR normal in all 4  ASSESSMENT: 1. Post ablative Hypothyroidism - Developed in 2011, after RAI treatment for Graves' disease in 2009  PLAN:  1. Patient with longstanding, post ablative hypothyroidism, previously on generic levothyroxine, then on Synthroid d.a.w., with significant improvement in her symptoms after changing the formulation.  She started to have less fatigue, less joint pains, hair loss, and headaches.  We increased her dose of Synthroid before last visit.  After this, she felt that her weight gain has stopped, she did not have any more mood swings and her menstrual cycles were regular.  However, since last visit, she went back to me 05/2019 for fatigue, insomnia, and decreased mood.  However, at that time, TFTs were normal: Lab  Results  Component Value Date   TSH 0.83 05/31/2019   - she continues on Synthroid d.a.w. 125 mcg daily - pt feels good on this dose. - we discussed about taking the thyroid hormone every day, with water, >30 minutes before breakfast, separated by >4 hours from acid reflux medications, calcium, iron, multivitamins. Pt. is taking it correctly. - will check thyroid tests today: TSH and fT4 - If labs are abnormal, she will need to return for repeat TFTs in 1.5 months - I will see her in 1 year  Needs refills x 3 mo.   Component     Latest Ref Rng & Units 12/05/2019  T4,Free(Direct)     0.60 - 1.60 ng/dL 1.47  TSH     0.35 - 4.50 uIU/mL 0.80  Normal TFTs.  Philemon Kingdom, MD PhD [facility] Endocrinology

## 2019-12-05 NOTE — Patient Instructions (Signed)
Please continue Synthroid 125 mcg every day.  Take the thyroid hormone every day, with water, at least 30 minutes before breakfast, separated by at least 4 hours from: - acid reflux medications - calcium - iron - multivitamins  Please stop at the lab.  Please come back for a follow-up appointment in 1 year. 

## 2019-12-06 ENCOUNTER — Encounter: Payer: Self-pay | Admitting: Internal Medicine

## 2019-12-06 MED ORDER — SYNTHROID 125 MCG PO TABS: ORAL_TABLET | ORAL | 3 refills | Status: DC

## 2020-07-05 ENCOUNTER — Other Ambulatory Visit (HOSPITAL_BASED_OUTPATIENT_CLINIC_OR_DEPARTMENT_OTHER): Payer: Self-pay

## 2020-07-05 ENCOUNTER — Other Ambulatory Visit: Payer: Self-pay

## 2020-07-05 ENCOUNTER — Ambulatory Visit: Payer: Self-pay | Attending: Internal Medicine

## 2020-07-05 DIAGNOSIS — Z23 Encounter for immunization: Secondary | ICD-10-CM

## 2020-07-05 MED ORDER — PFIZER-BIONT COVID-19 VAC-TRIS 30 MCG/0.3ML IM SUSP
INTRAMUSCULAR | 0 refills | Status: AC
  Filled 2020-07-05: qty 0.3, 1d supply, fill #0

## 2020-07-05 NOTE — Progress Notes (Signed)
   Covid-19 Vaccination Clinic  Name:  Laine Fonner    MRN: [id number] DOB: 01-22-1975  07/05/2020  Ms. Bugaj was observed post Covid-19 immunization for 15 minutes without incident. She was provided with Vaccine Information Sheet and instruction to access the V-Safe system.   Ms. Domino was instructed to call 911 with any severe reactions post vaccine: Difficulty breathing  Swelling of face and throat  A fast heartbeat  A bad rash all over body  Dizziness and weakness   Immunizations Administered     Name Date Dose VIS Date Route   PFIZER Comrnaty(Gray TOP) Covid-19 Vaccine 07/05/2020 10:00 AM 0.3 mL 12/21/2019 Intramuscular   Manufacturer: [place]   Lot: [id number]   [place]: [phone]

## 2020-09-05 ENCOUNTER — Other Ambulatory Visit: Payer: Self-pay | Admitting: Family Medicine

## 2020-09-05 DIAGNOSIS — Z1231 Encounter for screening mammogram for malignant neoplasm of breast: Secondary | ICD-10-CM

## 2020-09-14 ENCOUNTER — Ambulatory Visit
Admission: RE | Admit: 2020-09-14 | Discharge: 2020-09-14 | Disposition: A | Discharge status: Home / Self Care | Payer: BC Managed Care – PPO | Source: Ambulatory Visit | Attending: Family Medicine | Admitting: Family Medicine

## 2020-09-14 ENCOUNTER — Other Ambulatory Visit: Payer: Self-pay

## 2020-09-14 DIAGNOSIS — Z1231 Encounter for screening mammogram for malignant neoplasm of breast: Secondary | ICD-10-CM

## 2020-12-04 ENCOUNTER — Encounter: Payer: Self-pay | Admitting: Internal Medicine

## 2020-12-04 ENCOUNTER — Other Ambulatory Visit (INDEPENDENT_AMBULATORY_CARE_PROVIDER_SITE_OTHER): Payer: BC Managed Care – PPO

## 2020-12-04 ENCOUNTER — Ambulatory Visit (INDEPENDENT_AMBULATORY_CARE_PROVIDER_SITE_OTHER): Payer: BC Managed Care – PPO | Admitting: Internal Medicine

## 2020-12-04 ENCOUNTER — Other Ambulatory Visit: Payer: Self-pay

## 2020-12-04 VITALS — BP 120/80 | HR 71 | Ht 67.0 in | Wt 204.8 lb

## 2020-12-04 DIAGNOSIS — E890 Postprocedural hypothyroidism: Secondary | ICD-10-CM | POA: Diagnosis not present

## 2020-12-04 LAB — T4, FREE: Free T4: 1.41 ng/dL (ref 0.60–1.60)

## 2020-12-04 LAB — TSH: TSH: 0.54 u[IU]/mL (ref 0.35–5.50)

## 2020-12-04 MED ORDER — SYNTHROID 125 MCG PO TABS: ORAL_TABLET | ORAL | 3 refills | Status: DC

## 2020-12-04 NOTE — Progress Notes (Signed)
Patient ID: Carmen Greene, female   DOB: 12/05/1975, 45 y.o.   MRN: [id number]  This visit occurred during the SARS-CoV-2 public health emergency.  Safety protocols were in place, including screening questions prior to the visit, additional usage of staff PPE, and extensive cleaning of exam room while observing appropriate contact time as indicated for disinfecting solutions.   HPI  Carmen Greene is a 45 y.o.-year-old female, initially referred by her PCP, Dr. Carlena Sax, presenting for follow-up for hypothyroidism. Last visit 1 year ago.  Interim history: Still has hot flushes and skipped a menstrual cycle in October 03, 2020.  Mother died in her 26s (breast cancer) when pt. was 45 years old. Pt smoked for many years up until 8 years ago, when she quit.  Reviewed history: Pt. 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  Will change to Synthroid brand name in October 04, 2015.  She felt better after the change.  In 08/2018, she complained of: Brittle fingernails, some weight gain, forgetfulness, irregular menses.  She also experienced insomnia and mood swings and even had 1 panic attack.  At that time, her TSH was higher than her target and we increased her Synthroid slightly.  She felt better afterwards the the above symptoms resolved or improved significantly.  In 05/2019, she contacted me with increased fatigue, weight gain, decreased and insomnia.  TFTs were normal.  Pt is on Synthroid 125 mcg daily, taken: - in am - fasting - at least 30 min from b'fast - + calcium at night - no iron - + multivitamins at night - no PPIs - + on Biotin 1000 mcg in multivitamins-last dose yesterday   Reviewed her TFTs: Lab Results  Component Value Date   TSH 0.80 12/05/2019   TSH 0.83 05/31/2019   TSH 2.01 12/02/2018   TSH 0.66 06/17/2018   TSH 2.66 04/29/2018   TSH 1.21 08/11/2017   TSH 0.88  08/18/2016   TSH 1.56 01/03/2016   TSH 1.45 10/04/2015   FREET4 1.47 12/05/2019   FREET4 1.20 05/31/2019   FREET4 1.4 12/02/2018   FREET4 1.48 06/17/2018   FREET4 1.17 04/29/2018   FREET4 1.31 08/11/2017   FREET4 1.24 08/18/2016   FREET4 1.36 01/03/2016   FREET4 1.34 10/04/2015  08/09/2018: TSH 1.15, normal  Prev: 07/31/2105: TSH 2.52 - alternating the 112 with 125 mcg qod LT4 10/31/2014: TSH 0.47 - alternating the 112 with 125 mcg qod LT4 09/14/2014: TSH 0.442 - on 125 mcg daily ... 01/18/2013: TSH 2.56 12/05/2012: TSH 6.88 07/18/2012: TSH 1.29 12/25/2011: TSH 2.45 11/23/2011: TSH 36.45, free T4 4.6 >> started LT4 08/12/2010: TSH 4.93, free T4 0.91 09/30/2009: TSH 6.26, normal free t4  She feels her best when her TSH is at 1-1.5.  She initially described weight gain of 60 pounds after her hypothyroid diagnosis, however, she then lost 45 pounds after she started exercising 06/2014.  Pt denies: - feeling nodules in neck - hoarseness - dysphagia - choking - SOB with lying down  No FH of thyroid disease or thyroid cancer. No h/o radiation tx to head or neck.  No herbal supplements. No Biotin use except for the amount of multivitamins. No recent steroids use.   She works in the [employer].  ROS: + See HPI  I reviewed pt's medications, allergies, PMH, social hx, family hx, and changes were documented in the history of present illness.  Otherwise, unchanged from my initial visit note.  Past Medical History:  Diagnosis Date   Cyst of breast, diffuse fibrocystic    right   Cyst of breast, left, diffuse fibrocystic    Lesion of breast    left complex sclerosing   Thyroid disease 2009   Graves disease- borderline hypo   Past Surgical History:  Procedure Laterality Date   BREAST EXCISIONAL BIOPSY Left    BREAST SURGERY  09/24/10   lft- benign sclerosing lesion   TUBAL LIGATION  2000   hulka clips   Social History   Social History   Marital  status: Married    Spouse name: N/A   Number of children: 3   Occupational History   Control and instrumentation engineer - [employer]    Social History Main Topics   Smoking status: Former Smoker   Smokeless tobacco: Not on file     Comment: quit in 2012   Alcohol use 2 drinks once a week: Wine, beer, liquor     Drug use: No   Current Outpatient Medications on File Prior to Visit  Medication Sig Dispense Refill   ALPRAZolam (XANAX) 0.25 MG tablet Ad lib.     COVID-19 mRNA Vac-TriS, Pfizer, (PFIZER-BIONT COVID-19 VAC-TRIS) SUSP injection Inject into the muscle. 0.3 mL 0   Multiple Vitamin (MULTIVITAMIN) capsule Take 1 capsule by mouth daily.       SYNTHROID 125 MCG tablet TAKE 1 TABLET BY MOUTH DAILY BEFORE BREAKFAST. 90 tablet 3   vitamin C (ASCORBIC ACID) 500 MG tablet Take 500 mg by mouth daily.     No current facility-administered medications on file prior to visit.   No Known Allergies   Family History  Problem Relation Age of Onset   Cancer Mother 82       BREAST   Breast cancer Mother 54   Also, - Diabetes in paternal grandmother - Hypertension in heart disease in maternal grandmother  PE: BP 120/80 (BP Location: Right Arm, Patient Position: Sitting, Cuff Size: Normal)   Pulse 71   Ht 5\' 7"  (1.702 m)   Wt 204 lb 12.8 oz (92.9 kg)   SpO2 99%   BMI 32.08 kg/m  Wt Readings from Last 3 Encounters:  12/04/20 204 lb 12.8 oz (92.9 kg)  12/05/19 193 lb 3.2 oz (87.6 kg)  12/02/18 189 lb (85.7 kg)   Constitutional: overweight, in NAD Eyes: PERRLA, EOMI, no exophthalmos ENT: moist mucous membranes, no thyromegaly, no cervical lymphadenopathy Cardiovascular: RRR, No MRG Respiratory: CTA B Musculoskeletal: no deformities, strength intact in all 4 Skin: moist, warm, no rashes Neurological: no tremor with outstretched hands, DTR normal in all 4  ASSESSMENT: 1. Post ablative Hypothyroidism - Developed in 2011, after RAI treatment for Graves' disease in 2009  PLAN:  1.  Patient with longstanding, post ablative hypothyroidism, previously on generic levothyroxine, but now on Synthroid d.a.w., with significant improvement in her symptoms after changing the formulation.  She started to have less fatigue, joint days, hair loss, and headaches.  Her menstrual cycles were regular, she did not gain weight or had mood swings.  However, before last visit, she again developed fatigue, insomnia, and decreased mood.  TFTs were normal.  She felt that symptoms were possibly related to perimenopause.  As of now, she denies increased fatigue, hair loss, constipation, significant weight changes but still has some flashes and occasionally skipped menstrual cycle.  We discussed that she may be perimenopausal, although this is early, before the average age of menopause  45 years old.   - latest thyroid labs reviewed with pt. >> normal: Lab Results  Component Value Date   TSH 0.80 12/05/2019  - she continues on LT4 (Synthroid d.a.w.) 125 mcg daily - we discussed about taking the thyroid hormone every day, with water, >30 minutes before breakfast, separated by >4 hours from acid reflux medications, calcium, iron, multivitamins. Pt. is taking it correctly. - will check thyroid tests today: TSH and fT4 - If labs are abnormal, she will need to return for repeat TFTs in 1.5 months - OTW, I will see her back in a year  Needs refills x 3 mo.   Component     Latest Ref Rng & Units 12/04/2020  T4,Free(Direct)     0.60 - 1.60 ng/dL 1.41  TSH     0.35 - 5.50 uIU/mL 0.54  TFTs are normal.  We can continue the same dose of Synthroid.  Philemon Kingdom, MD PhD [facility] Endocrin[facility]

## 2020-12-04 NOTE — Patient Instructions (Signed)
Please continue Synthroid 125 mcg every day.  Take the thyroid hormone every day, with water, at least 30 minutes before breakfast, separated by at least 4 hours from: - acid reflux medications - calcium - iron - multivitamins  Please stop at the lab.  Please come back for a follow-up appointment in 1 year. 

## 2021-10-25 ENCOUNTER — Ambulatory Visit (HOSPITAL_BASED_OUTPATIENT_CLINIC_OR_DEPARTMENT_OTHER)
Admission: RE | Admit: 2021-10-25 | Discharge: 2021-10-25 | Disposition: A | Discharge status: Home / Self Care | Payer: BC Managed Care – PPO | Source: Ambulatory Visit | Attending: Internal Medicine | Admitting: Internal Medicine

## 2021-10-25 DIAGNOSIS — Z1231 Encounter for screening mammogram for malignant neoplasm of breast: Secondary | ICD-10-CM | POA: Diagnosis present

## 2021-12-03 ENCOUNTER — Ambulatory Visit: Payer: BC Managed Care – PPO | Admitting: Internal Medicine

## 2021-12-03 ENCOUNTER — Encounter: Payer: Self-pay | Admitting: Internal Medicine

## 2021-12-03 VITALS — BP 118/72 | HR 80 | Ht 67.0 in | Wt 204.0 lb

## 2021-12-03 DIAGNOSIS — E890 Postprocedural hypothyroidism: Secondary | ICD-10-CM | POA: Diagnosis not present

## 2021-12-03 LAB — TSH: TSH: 0.54 u[IU]/mL (ref 0.35–5.50)

## 2021-12-03 LAB — T4, FREE: Free T4: 1.66 ng/dL — ABNORMAL HIGH (ref 0.60–1.60)

## 2021-12-03 MED ORDER — SYNTHROID 125 MCG PO TABS: ORAL_TABLET | ORAL | 3 refills | Status: DC

## 2021-12-03 NOTE — Patient Instructions (Signed)
Please continue Synthroid 125 mcg every day.  Take the thyroid hormone every day, with water, at least 30 minutes before breakfast, separated by at least 4 hours from: - acid reflux medications - calcium - iron - multivitamins  Please stop at the lab.  Please come back for a follow-up appointment in 1 year.

## 2021-12-03 NOTE — Progress Notes (Signed)
Patient ID: Carmen Greene, female   DOB: [birth year], 46 y.o.   MRN: [id number]  HPI  Carmen Greene is a 46 y.o.-year-old female, initially referred by her PCP, Dr. Carlena Sax, presenting for follow-up for hypothyroidism. Last visit 1 year ago.  Interim history: Still continues to have hot flashes, night sweats.  Last menstrual cycle was just spotting. She also has migraines.  Reviewed history: Pt. 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  Will change to Synthroid brand name in 09/2015.  She felt better after the change.  In 08/2018, she complained of: Brittle fingernails, some weight gain, forgetfulness, irregular menses.  She also experienced insomnia and mood swings and even had 1 panic attack.  At that time, her TSH was higher than her target and we increased her Synthroid slightly.  She felt better afterwards the the above symptoms resolved or improved significantly.  In 05/2019, she contacted me with increased fatigue, weight gain, decreased and insomnia.  TFTs were normal.  Pt is on Synthroid 125 mcg daily, taken: - in am - fasting - at least 30 min from b'fast - + calcium at night - no iron - + multivitamins at night - no PPIs - + on Biotin 1000 mcg in multivitamins-last dose 2 days ago   Reviewed her TFTs: Lab Results  Component Value Date   TSH 0.54 12/04/2020   TSH 0.80 12/05/2019   TSH 0.83 05/31/2019   TSH 2.01 12/02/2018   TSH 0.66 06/17/2018   TSH 2.66 04/29/2018   TSH 1.21 08/11/2017   TSH 0.88 08/18/2016   TSH 1.56 01/03/2016   TSH 1.45 10/04/2015   FREET4 1.41 12/04/2020   FREET4 1.47 12/05/2019   FREET4 1.20 05/31/2019   FREET4 1.4 12/02/2018   FREET4 1.48 06/17/2018   FREET4 1.17 04/29/2018   FREET4 1.31 08/11/2017   FREET4 1.24 08/18/2016   FREET4 1.36 01/03/2016   FREET4 1.34 10/04/2015  08/09/2018: TSH 1.15,  normal  Prev: 07/31/2105: TSH 2.52 - alternating the 112 with 125 mcg qod LT4 10/31/2014: TSH 0.47 - alternating the 112 with 125 mcg qod LT4 09/14/2014: TSH 0.442 - on 125 mcg daily ... 01/18/2013: TSH 2.56 12/05/2012: TSH 6.88 07/18/2012: TSH 1.29 12/25/2011: TSH 2.45 11/23/2011: TSH 36.45, free T4 4.6 >> started LT4 08/12/2010: TSH 4.93, free T4 0.91 09/30/2009: TSH 6.26, normal free t4  She feels her best when her TSH is at 1-1.5.  She initially described weight gain of 60 pounds after her hypothyroid diagnosis, however, she then lost 45 pounds after she started exercising 06/2014.  Pt denies: - feeling nodules in neck - hoarseness - dysphagia - choking  No FH of thyroid disease or thyroid cancer. No h/o radiation tx to head or neck. No herbal supplements. No Biotin use except for the amount of multivitamins. No recent steroids use.   Mother died in her 19s (breast cancer) when pt. was 46 years old.  She works in the [employer].  ROS: + See HPI  I reviewed pt's medications, allergies, PMH, social hx, family hx, and changes were documented in the history of present illness. Otherwise, unchanged from my initial visit note.  Past Medical History:  Diagnosis Date   Cyst of breast, diffuse fibrocystic    right   Cyst of breast, left, diffuse fibrocystic    Lesion of breast    left complex  sclerosing   Thyroid disease 2009   Graves disease- borderline hypo   Past Surgical History:  Procedure Laterality Date   BREAST EXCISIONAL BIOPSY Left    BREAST SURGERY  09/24/10   lft- benign sclerosing lesion   TUBAL LIGATION  2000   hulka clips   Social History   Social History   Marital status: Married    Spouse name: N/A   Number of children: 3   Occupational History   Control and instrumentation engineer - [employer]    Social History Main Topics   Smoking status: Former Smoker   Smokeless tobacco: Not on file     Comment: quit in 2012   Alcohol use  2 drinks once a week: Wine, beer, liquor     Drug use: No   Current Outpatient Medications on File Prior to Visit  Medication Sig Dispense Refill   ALPRAZolam (XANAX) 0.25 MG tablet Ad lib.     COVID-19 mRNA Vac-TriS, Pfizer, (PFIZER-BIONT COVID-19 VAC-TRIS) SUSP injection Inject into the muscle. 0.3 mL 0   Multiple Vitamin (MULTIVITAMIN) capsule Take 1 capsule by mouth daily.       SUMAtriptan (IMITREX) 50 MG tablet 1 tablet at start of migraine. may repeat x 1 dose after 1 hour as needed. max dose 2 per 24 hours.     SYNTHROID 125 MCG tablet TAKE 1 TABLET BY MOUTH DAILY BEFORE BREAKFAST. 90 tablet 3   vitamin C (ASCORBIC ACID) 500 MG tablet Take 500 mg by mouth daily.     No current facility-administered medications on file prior to visit.   No Known Allergies   Family History  Problem Relation Age of Onset   Cancer Mother 81       BREAST   Breast cancer Mother 32   Also, - Diabetes in paternal grandmother - Hypertension in heart disease in maternal grandmother  PE: BP 118/72 (BP Location: Right Arm, Patient Position: Sitting, Cuff Size: Normal)   Pulse 80   Ht '5\' 7"'$  (1.702 m)   Wt 204 lb (92.5 kg)   SpO2 98%   BMI 31.95 kg/m  Wt Readings from Last 3 Encounters:  12/03/21 204 lb (92.5 kg)  12/04/20 204 lb 12.8 oz (92.9 kg)  12/05/19 193 lb 3.2 oz (87.6 kg)   Constitutional: overweight, in NAD Eyes:  EOMI, no exophthalmos ENT: no neck masses, no cervical lymphadenopathy Cardiovascular: RRR, No MRG Respiratory: CTA B Musculoskeletal: no deformities Skin:no rashes Neurological: no tremor with outstretched hands  ASSESSMENT: 1. Post ablative Hypothyroidism - Developed in 2011, after RAI treatment for Graves' disease in 2009  PLAN:  1. Patient with longstanding, post ablative hypothyroidism, previously on generic levothyroxine, now on Synthroid d.a.w., with significant improvement in her symptoms after changing the formulation. -However, she has intermittent  fatigue, insomnia, decreased mood.  At last visit she felt that some of her symptoms were related to perimenopause. - latest thyroid labs reviewed with pt. >> normal: Lab Results  Component Value Date   TSH 0.54 12/04/2020   Lab Results  Component Value Date   FREET4 1.41 12/04/2020  - she continues on Synthroid d.a.w. 125 mcg daily - pt feels good on this dose.  She mentions that she gained few pounds since last visit, but then she was able to lose them. - we discussed about taking the thyroid hormone every day, with water, >30 minutes before breakfast, separated by >4 hours from acid reflux medications, calcium, iron, multivitamins. Pt. is taking it correctly. - will check thyroid  tests today: TSH and fT4 - If labs are abnormal, she will need to return for repeat TFTs in 1.5 months - OTW, I will see her back in a year  She needs refills.   Component     Latest Ref Rng 12/03/2021  TSH     0.35 - 5.50 uIU/mL 0.54   T4,Free(Direct)     0.60 - 1.60 ng/dL 1.66 (H)     TSH normal.  Free T4 only slightly high.  No changes needed in her regimen.  Refilled her Synthroid.  Philemon Kingdom, MD PhD [facility] Endocrinology

## 2022-01-14 ENCOUNTER — Encounter: Payer: Self-pay | Admitting: *Deleted

## 2022-01-14 NOTE — Progress Notes (Signed)
Received referral from [place] at [place] for genetics. Forwarded referral to [facility] via fax.

## 2022-01-22 ENCOUNTER — Telehealth: Payer: Self-pay | Admitting: Genetic Counselor

## 2022-01-22 NOTE — Telephone Encounter (Signed)
Patient called to confirm new appointment time/date. Patient notified.

## 2022-03-03 ENCOUNTER — Encounter: Payer: BC Managed Care – PPO | Admitting: Licensed Clinical Social Worker

## 2022-03-03 ENCOUNTER — Other Ambulatory Visit: Payer: BC Managed Care – PPO

## 2022-03-09 ENCOUNTER — Encounter: Payer: BC Managed Care – PPO | Admitting: Genetic Counselor

## 2022-03-09 ENCOUNTER — Other Ambulatory Visit: Payer: BC Managed Care – PPO

## 2022-04-08 ENCOUNTER — Inpatient Hospital Stay: Payer: BC Managed Care – PPO | Attending: Genetic Counselor | Admitting: Genetic Counselor

## 2022-04-08 ENCOUNTER — Inpatient Hospital Stay: Payer: BC Managed Care – PPO

## 2022-04-08 ENCOUNTER — Other Ambulatory Visit: Payer: Self-pay

## 2022-04-08 ENCOUNTER — Other Ambulatory Visit: Payer: Self-pay | Admitting: Genetic Counselor

## 2022-04-08 ENCOUNTER — Encounter: Payer: Self-pay | Admitting: Genetic Counselor

## 2022-04-08 DIAGNOSIS — Z803 Family history of malignant neoplasm of breast: Secondary | ICD-10-CM

## 2022-04-08 DIAGNOSIS — N649 Disorder of breast, unspecified: Secondary | ICD-10-CM

## 2022-04-08 DIAGNOSIS — Z800 Family history of malignant neoplasm of digestive organs: Secondary | ICD-10-CM | POA: Insufficient documentation

## 2022-04-08 LAB — GENETIC SCREENING ORDER

## 2022-04-08 NOTE — Progress Notes (Addendum)
REFERRING PROVIDER: Farris Has, MD [address] [place],  [place] [address]  PRIMARY PROVIDER:  Farris Has, MD  PRIMARY REASON FOR VISIT:  1. Family history of breast cancer   2. Family history of pancreatic cancer   3. Family history of colon cancer   4. Family history of stomach cancer   5. Lesion of breast      HISTORY OF PRESENT ILLNESS:   Carmen Greene, a 47 y.o. female, was seen for a [facility] cancer genetics consultation at the request of Dr. Kateri Plummer due to a family history of cancer.  Ms. Baechle presents to clinic today to discuss the possibility of a hereditary predisposition to cancer, genetic testing, and to further clarify her future cancer risks, as well as potential cancer risks for family members.   Ms. Feuling is a 47 y.o. female with no personal history of cancer.  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  CANCER HISTORY:  Oncology History   No history exists.     RISK FACTORS:  Menarche was at age 75-13.  First live birth at age 6.  OCP use for approximately 2 years.  Ovaries intact: yes.  Hysterectomy: no.  Menopausal status: perimenopausal.  HRT use: 0 years. Colonoscopy: no; not examined. Mammogram within the last year: yes. Number of breast biopsies: 2. Up to date with pelvic exams: yes. Any excessive radiation exposure in the past: no  Past Medical History:  Diagnosis Date   Cyst of breast, diffuse fibrocystic    right   Cyst of breast, left, diffuse fibrocystic    Family history of breast cancer    Family history of colon cancer    Family history of pancreatic cancer    Family history of stomach cancer    Lesion of breast    left complex sclerosing   Thyroid disease 01/13/2007   Graves disease- borderline hypo    Past Surgical History:  Procedure Laterality Date   BREAST EXCISIONAL BIOPSY Left    BREAST SURGERY  09/24/10   lft- benign sclerosing lesion   TUBAL LIGATION  2000   hulka clips     Social History   Socioeconomic History   Marital status: Married    Spouse name: Not on file   Number of children: Not on file   Years of education: Not on file   Highest education level: Not on file  Occupational History   Not on file  Tobacco Use   Smoking status: Former   Smokeless tobacco: Never   Tobacco comments:    quit in 2012  Substance and Sexual Activity   Alcohol use: No   Drug use: No   Sexual activity: Not on file  Other Topics Concern   Not on file  Social History Narrative   Not on file   Social Determinants of Health   Financial Resource Strain: Not on file  Food Insecurity: Not on file  Transportation Needs: Not on file  Physical Activity: Not on file  Stress: Not on file  Social Connections: Not on file     FAMILY HISTORY:  We obtained a detailed, 4-generation family history.  Significant diagnoses are listed below: Family History  Problem Relation Age of Onset   Breast cancer Mother 62   Melanoma Maternal Aunt        dx. early 5s; GT neg   Lung cancer Maternal Uncle    Colon cancer Paternal Aunt  dx. late 70s   Pancreatic cancer Paternal Uncle        dx mid 51s   Heart attack Maternal Grandfather    Colon cancer Paternal Grandmother        dx over 24   Pancreatic cancer Other        MGM's brother   Breast cancer Other        MGMs sister   Breast cancer Other        MGMs sister   Stomach cancer Other        MGMs sister   Kidney cancer Other        MGMs brother      The patient has three daughters who are cancer free. She has two brothers who are cancer free.  Her mother is deceased and her father is living.  The patient's mother was diagnosed with breast cancer at 58.  She has a brother and sister.  Her brother died of lung cancer and was a smoker.  Her sister had melanoma in her 107's and reportedly had negative genetic testing.  The maternal grandparents are deceased and did not have cancer.  The grandmother had a  brother with kidney cancer, a brother with pancreatic cancer, a sister with breast cancer, another with colon and a third with both breast and stomach cancer.  The patient's father does not have cancer.  He has three brothers and a sister.  The sister had colon cancer in her 77's and a brother had early stage pancreatic cancer.  The paternal grandmother had colon cancer.  Ms. Musco is aware of previous family history of genetic testing for hereditary cancer risks. Patient's maternal ancestors are of Caucasian descent, and paternal ancestors are of [place] descent. There is no reported Ashkenazi Jewish ancestry. There is no known consanguinity.  GENETIC COUNSELING ASSESSMENT: Ms. Hathway is a 47 y.o. female with a family history of cancer which is somewhat suggestive of a hereditary breast cancer syndrome and predisposition to cancer given the young age of onset and combination of cancer on both sides of her family. We, therefore, discussed and recommended the following at today's visit.   DISCUSSION: We discussed that, in general, most cancer is not inherited in families, but instead is sporadic or familial. Sporadic cancers occur by chance and typically happen at older ages (>50 years) as this type of cancer is caused by genetic changes acquired during an individual's lifetime. Some families have more cancers than would be expected by chance; however, the ages or types of cancer are not consistent with a known genetic mutation or known genetic mutations have been ruled out. This type of familial cancer is thought to be due to a combination of multiple genetic, environmental, hormonal, and lifestyle factors. While this combination of factors likely increases the risk of cancer, the exact source of this risk is not currently identifiable or testable.  We discussed that 5 - 10% of breast cancer is hereditary, with most cases associated with BRCA mutations.  There are other genes that can be associated with  hereditary breast cancer syndromes.  These include ATM, CHEK2 and PALB2.  Based on her father's side of the family there is a potential risk for Lynch syndrome due to the combination of pancreatic and colon cancer.  We discussed that testing is beneficial for several reasons including knowing how to follow individuals after completing their treatment, identifying whether potential treatment options such as PARP inhibitors would be beneficial, and understand if other family  members could be at risk for cancer and allow them to undergo genetic testing.   We reviewed the characteristics, features and inheritance patterns of hereditary cancer syndromes. We also discussed genetic testing, including the appropriate family members to test, the process of testing, insurance coverage and turn-around-time for results. We discussed the implications of a negative, positive, carrier and/or variant of uncertain significant result. Ms. Misa  was offered a common hereditary cancer panel (47 genes) and an expanded pan-cancer panel (77 genes). Ms. Rieb was informed of the benefits and limitations of each panel, including that expanded pan-cancer panels contain genes that do not have clear management guidelines at this point in time.  We also discussed that as the number of genes included on a panel increases, the chances of variants of uncertain significance increases. Ms. Mann decided to pursue genetic testing for the CancerNext-Expanded+RNAinsight gene panel.   The CancerNext-Expanded gene panel offered by [employer] and includes sequencing and rearrangement analysis for the following 71 genes: AIP, ALK, APC, ATM, BAP1, BARD1, BMPR1A, BRCA1, BRCA2, BRIP1, CDC73, CDH1, CDK4, CDKN1B, CDKN2A, CHEK2, DICER1, FH, FLCN, KIF1B, LZTR1, MAX, MEN1, MET, MLH1, MSH2, MSH6, MUTYH, NF1, NF2, NTHL1, PALB2, PHOX2B, PMS2, POT1, PRKAR1A, PTCH1, PTEN, RAD51C, RAD51D, RB1, RET, SDHA, SDHAF2, SDHB, SDHC, SDHD, SMAD4, SMARCA4, SMARCB1, SMARCE1,  STK11, SUFU, TMEM127, TP53, TSC1, TSC2 and VHL (sequencing and deletion/duplication); AXIN2, CTNNA1, EGFR, EGLN1, HOXB13, KIT, MITF, MSH3, PDGFRA, POLD1 and POLE (sequencing only); EPCAM and GREM1 (deletion/duplication only). RNA data is routinely analyzed for use in variant interpretation for all genes.   Based on Ms. Shewell's family history of cancer, she meets medical criteria for genetic testing. Despite that she meets criteria, she may still have an out of pocket cost. We discussed that if her out of pocket cost for testing is over $100, the laboratory will call and confirm whether she wants to proceed with testing.  If the out of pocket cost of testing is less than $100 she will be billed by the genetic testing laboratory.   We discussed that some people do not want to undergo genetic testing due to fear of genetic discrimination.  The Genetic Information Nondiscrimination Act (GINA) was signed into federal law in 2008. GINA prohibits health insurers and most employers from discriminating against individuals based on genetic information (including the results of genetic tests and family history information). According to GINA, health insurance companies cannot consider genetic information to be a preexisting condition, nor can they use it to make decisions regarding coverage or rates. GINA also makes it illegal for most employers to use genetic information in making decisions about hiring, firing, promotion, or terms of employment. It is important to note that GINA does not offer protections for life insurance, disability insurance, or long-term care insurance. GINA does not apply to those in the [employer], those who work for companies with less than 15 employees, and new life insurance or long-term disability insurance policies.  Health status due to a cancer diagnosis is not protected under GINA. More information about GINA can be found by visiting [email or web address].   Based on the patient's family  history, a statistical model (Tyrer Cusik) was used to estimate her risk of developing breast cancer. This estimates her lifetime risk of developing breast cancer to be approximately 21.9%. This estimation does not consider any genetic testing results.  The patient's lifetime breast cancer risk is a preliminary estimate based on available information using one of several models endorsed by the American Cancer Society (ACS). The  ACS recommends consideration of breast MRI screening as an adjunct to mammography for patients at high risk (defined as 20% or greater lifetime risk). Please note that a woman's breast cancer risk changes over time. It may increase or decrease based on age and any changes to the personal and/or family medical history. The risks and recommendations listed above apply to this patient at this point in time. In the future, she may or may not be eligible for the same medical management strategies and, in some cases, other medical management strategies may become available to her. If she is interested in an updated breast cancer risk assessment at a later date, she can contact us.  Ms. Mcelmurry has been determined to be at high risk for breast cancer.  Therefore, we recommend that annual screening with mammography and breast MRI be performed.  We discussed that Ms. Dermody should discuss her individual situation with her referring physician and determine a breast cancer screening plan with which they are both comfortable.      PLAN: After considering the risks, benefits, and limitations, Ms. Schwertner provided informed consent to pursue genetic testing and the blood sample was sent to [facility] for analysis of the CancerNext-Expanded+RNAinsight. Results should be available within approximately 2-3 weeks' time, at which point they will be disclosed by telephone to Ms. Ensminger, as will any additional recommendations warranted by these results. Ms. Resendiz will receive a summary of her genetic  counseling visit and a copy of her results once available. This information will also be available in Epic.   Lastly, we encouraged Ms. Steines to remain in contact with cancer genetics annually so that we can continuously update the family history and inform her of any changes in cancer genetics and testing that may be of benefit for this family.   Ms. Antinori questions were answered to her satisfaction today. Our contact information was provided should additional questions or concerns arise. Thank you for the referral and allowing [place] to share in the care of your patient.   Mercede Rollo P. Lowell Guitar, MS, [facility] Licensed, Patent attorney [email or web address] phone: [phone]  The patient was seen for a total of 45 minutes in face-to-face genetic counseling.  The patient was seen alone.  Drs. Meliton Rattan, and/or [place] were available for questions, if needed..    _______________________________________________________________________ For Office Staff:  Number of people involved in session: 1 Was an Intern/ student involved with case: no

## 2022-05-01 ENCOUNTER — Encounter: Payer: Self-pay | Admitting: Genetic Counselor

## 2022-05-01 DIAGNOSIS — Z1379 Encounter for other screening for genetic and chromosomal anomalies: Secondary | ICD-10-CM | POA: Insufficient documentation

## 2022-05-04 ENCOUNTER — Telehealth: Payer: Self-pay | Admitting: Genetic Counselor

## 2022-05-04 NOTE — Telephone Encounter (Signed)
LM on VM that results are back and to please call.  Left CB number. 

## 2022-05-05 NOTE — Telephone Encounter (Signed)
Revealed negative genetic testing.  Discussed that we do not know why there is cancer in the family. It could be due to a different gene that we are not testing, or maybe our current technology may not be able to pick something up.  It will be important for her to keep in contact with genetics to keep up with whether additional testing may be needed.  

## 2022-05-07 ENCOUNTER — Ambulatory Visit: Payer: Self-pay | Admitting: Genetic Counselor

## 2022-05-07 DIAGNOSIS — Z1379 Encounter for other screening for genetic and chromosomal anomalies: Secondary | ICD-10-CM

## 2022-05-07 NOTE — Progress Notes (Addendum)
HPI:  Ms. Tancredi was previously seen in the [facility] due to a family history of cancer and concerns regarding a hereditary predisposition to cancer. Please refer to our prior cancer genetics clinic note for more information regarding our discussion, assessment and recommendations, at the time. Ms. Schares recent genetic test results were disclosed to her, as were recommendations warranted by these results. These results and recommendations are discussed in more detail below.  CANCER HISTORY:  Oncology History   No history exists.    FAMILY HISTORY:  We obtained a detailed, 4-generation family history.  Significant diagnoses are listed below: Family History  Problem Relation Age of Onset   Breast cancer Mother 5   Melanoma Maternal Aunt        dx. early 6s; GT neg   Lung cancer Maternal Uncle    Colon cancer Paternal Aunt        dx. late 5s   Pancreatic cancer Paternal Uncle        dx mid 41s   Heart attack Maternal Grandfather    Colon cancer Paternal Grandmother        dx over 44   Pancreatic cancer Other        MGM's brother   Breast cancer Other        MGMs sister   Breast cancer Other        MGMs sister   Stomach cancer Other        MGMs sister   Kidney cancer Other        MGMs brother         The patient has three daughters who are cancer free. She has two brothers who are cancer free.  Her mother is deceased and her father is living.   The patient's mother was diagnosed with breast cancer at 77.  She has a brother and sister.  Her brother died of lung cancer and was a smoker.  Her sister had melanoma in her 56's and reportedly had negative genetic testing.  The maternal grandparents are deceased and did not have cancer.  The grandmother had a brother with kidney cancer, a brother with pancreatic cancer, a sister with breast cancer, another with colon and a third with both breast and stomach cancer.   The patient's father does not have cancer.   He has three brothers and a sister.  The sister had colon cancer in her 42's and a brother had early stage pancreatic cancer.  The paternal grandmother had colon cancer.   Ms. Mattia is aware of previous family history of genetic testing for hereditary cancer risks. Patient's maternal ancestors are of Caucasian descent, and paternal ancestors are of [place] descent. There is no reported Ashkenazi Jewish ancestry. There is no known consanguinity  GENETIC TEST RESULTS: Genetic testing reported out on April 20, 2022 through the CancerNext-Expanded+RNAinsight cancer panel found no pathogenic mutations. The CancerNext-Expanded gene panel offered by [employer] and includes sequencing and rearrangement analysis for the following 71 genes: AIP, ALK, APC, ATM, BAP1, BARD1, BMPR1A, BRCA1, BRCA2, BRIP1, CDC73, CDH1, CDK4, CDKN1B, CDKN2A, CHEK2, DICER1, FH, FLCN, KIF1B, LZTR1, MAX, MEN1, MET, MLH1, MSH2, MSH6, MUTYH, NF1, NF2, NTHL1, PALB2, PHOX2B, PMS2, POT1, PRKAR1A, PTCH1, PTEN, RAD51C, RAD51D, RB1, RET, SDHA, SDHAF2, SDHB, SDHC, SDHD, SMAD4, SMARCA4, SMARCB1, SMARCE1, STK11, SUFU, TMEM127, TP53, TSC1, TSC2 and VHL (sequencing and deletion/duplication); AXIN2, CTNNA1, EGFR, EGLN1, HOXB13, KIT, MITF, MSH3, PDGFRA, POLD1 and POLE (sequencing only); EPCAM and GREM1 (deletion/duplication only). RNA data is  routinely analyzed for use in variant interpretation for all genes.  The test report has been scanned into EPIC and is located under the Molecular Pathology section of the Results Review tab.  A portion of the result report is included below for reference.     We discussed with Ms. Montas that because current genetic testing is not perfect, it is possible there may be a gene mutation in one of these genes that current testing cannot detect, but that chance is small.  We also discussed, that there could be another gene that has not yet been discovered, or that we have not yet tested, that is responsible for the cancer  diagnoses in the family. It is also possible there is a hereditary cause for the cancer in the family that Ms. Lenton did not inherit and therefore was not identified in her testing.  Therefore, it is important to remain in touch with cancer genetics in the future so that we can continue to offer Ms. Krolak the most up to date genetic testing.   ADDITIONAL GENETIC TESTING: We discussed with Ms. Manzanares that her genetic testing was fairly extensive.  If there are genes identified to increase cancer risk that can be analyzed in the future, we would be happy to discuss and coordinate this testing at that time.    CANCER SCREENING RECOMMENDATIONS: Ms. Bianchi test result is considered negative (normal).  This means that we have not identified a hereditary cause for her family history of cancer at this time. Most cancers happen by chance and this negative test suggests that her cancer may fall into this category.    While reassuring, this does not definitively rule out a hereditary predisposition to cancer. It is still possible that there could be genetic mutations that are undetectable by current technology. There could be genetic mutations in genes that have not been tested or identified to increase cancer risk.  Therefore, it is recommended she continue to follow the cancer management and screening guidelines provided by her oncology and primary healthcare provider.   An individual's cancer risk and medical management are not determined by genetic test results alone. Overall cancer risk assessment incorporates additional factors, including personal medical history, family history, and any available genetic information that may result in a personalized plan for cancer prevention and surveillance   Ms. Mccraney has been determined to be at high risk for breast cancer. her Tyrer-Cuzick risk score is 21.9%.  For women with a greater than 20% lifetime risk of breast cancer, the [employer] (NCCN)  recommends the following:   1.      Clinical encounter every 6-12 months to begin when identified as being at increased risk, but not before age 94  2.      Annual mammograms. Tomosynthesis is recommended starting 10 years earlier than the youngest breast cancer diagnosis in the family or at age 33 (whichever comes first), but not before age 75    101.      Annual breast MRI starting 10 years earlier than the youngest breast cancer diagnosis in the family or at age 77 (whichever comes first), but not before age 81.   We, therefore, discussed that it is reasonable for Ms. Riehl to be followed by a high-risk breast cancer clinic; in addition to a yearly mammogram and physical exam by a healthcare provider, she should discuss the usefulness of an annual breast MRI with the high-risk clinic providers.  RECOMMENDATIONS FOR FAMILY MEMBERS:  Individuals in  this family might be at some increased risk of developing cancer, over the general population risk, simply due to the family history of cancer.  We recommended women in this family have a yearly mammogram beginning at age 37, or 59 years younger than the earliest onset of cancer, an annual clinical breast exam, and perform monthly breast self-exams. Women in this family should also have a gynecological exam as recommended by their primary provider. All family members should be referred for colonoscopy starting at age 26.  FOLLOW-UP: Lastly, we discussed with Ms. Laudon that cancer genetics is a rapidly advancing field and it is possible that new genetic tests will be appropriate for her and/or her family members in the future. We encouraged her to remain in contact with cancer genetics on an annual basis so we can update her personal and family histories and let her know of advances in cancer genetics that may benefit this family.   Our contact number was provided. Ms. Puck questions were answered to her satisfaction, and she knows she is welcome to call us at  anytime with additional questions or concerns.   Maylon Cos, MS, [facility] Licensed, Certified Genetic Counselor [email or web address]

## 2022-12-09 ENCOUNTER — Ambulatory Visit (HOSPITAL_BASED_OUTPATIENT_CLINIC_OR_DEPARTMENT_OTHER)
Admission: RE | Admit: 2022-12-09 | Discharge: 2022-12-09 | Disposition: A | Discharge status: Home / Self Care | Payer: BC Managed Care – PPO | Source: Ambulatory Visit | Attending: Family Medicine | Admitting: Family Medicine

## 2022-12-09 ENCOUNTER — Encounter (HOSPITAL_BASED_OUTPATIENT_CLINIC_OR_DEPARTMENT_OTHER): Payer: Self-pay | Admitting: Radiology

## 2022-12-09 DIAGNOSIS — Z1231 Encounter for screening mammogram for malignant neoplasm of breast: Secondary | ICD-10-CM | POA: Diagnosis present

## 2022-12-17 ENCOUNTER — Other Ambulatory Visit: Payer: Self-pay | Admitting: Internal Medicine

## 2023-01-04 ENCOUNTER — Ambulatory Visit: Payer: BC Managed Care – PPO | Admitting: Internal Medicine

## 2023-01-04 ENCOUNTER — Encounter: Payer: Self-pay | Admitting: Internal Medicine

## 2023-01-04 VITALS — BP 110/60 | HR 87 | Ht 67.0 in | Wt 218.2 lb

## 2023-01-04 DIAGNOSIS — E890 Postprocedural hypothyroidism: Secondary | ICD-10-CM | POA: Diagnosis not present

## 2023-01-04 NOTE — Patient Instructions (Signed)
Please continue Synthroid 125 mcg every day.  Take the thyroid hormone every day, with water, at least 30 minutes before breakfast, separated by at least 4 hours from: - acid reflux medications - calcium - iron - multivitamins  Please stop at the lab.  Please come back for a follow-up appointment in 1 year. 

## 2023-01-04 NOTE — Progress Notes (Signed)
Patient ID: Apolline Dimitroff, female   DOB: [birth year], 47 y.o.   MRN: [id number]  HPI  Riyanna Radka is a 47 y.o.-year-old female, initially referred by her PCP, Dr. Sandi Mealy, presenting for follow-up for hypothyroidism. Last visit 1 year ago.  Interim history: Still continues to have night sweats. Hot flushes resolved. She has irregular menses. She also has menstrual migraines. She had a labial abscess that was lanced last week >> now on Bactrim, but may change to another ABx based on the new culture results.  Reviewed history: Pt. 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  Will change to Synthroid brand name in 09/2015.  She felt better after the change.  In 08/2018, she complained of: Brittle fingernails, some weight gain, forgetfulness, irregular menses.  She also experienced insomnia and mood swings and even had 1 panic attack.  At that time, her TSH was higher than her target and we increased her Synthroid slightly.  She felt better afterwards the the above symptoms resolved or improved significantly.  In 05/2019, she contacted me with increased fatigue, weight gain, decreased and insomnia.  TFTs were normal.  She later attributed the symptoms to perimenopause.  Pt is on Synthroid 125 mcg daily, taken: - in am - fasting - at least 30 min from b'fast - + calcium at night - no iron - + multivitamins at night - no PPIs - + on Biotin 1000 mcg in multivitamins-last dose 2 days ago   Reviewed her TFTs: 08/18/2022: TSH 0.960 Lab Results  Component Value Date   TSH 0.54 12/03/2021   TSH 0.54 12/04/2020   TSH 0.80 12/05/2019   TSH 0.83 05/31/2019   TSH 2.01 12/02/2018   TSH 0.66 06/17/2018   TSH 2.66 04/29/2018   TSH 1.21 08/11/2017   TSH 0.88 08/18/2016   TSH 1.56 01/03/2016   FREET4 1.66 (H) 12/03/2021   FREET4 1.41 12/04/2020   FREET4 1.47 12/05/2019   FREET4  1.20 05/31/2019   FREET4 1.4 12/02/2018   FREET4 1.48 06/17/2018   FREET4 1.17 04/29/2018   FREET4 1.31 08/11/2017   FREET4 1.24 08/18/2016   FREET4 1.36 01/03/2016  08/09/2018: TSH 1.15, normal  Prev: 07/31/2105: TSH 2.52 - alternating the 112 with 125 mcg qod LT4 10/31/2014: TSH 0.47 - alternating the 112 with 125 mcg qod LT4 09/14/2014: TSH 0.442 - on 125 mcg daily ... 01/18/2013: TSH 2.56 12/05/2012: TSH 6.88 07/18/2012: TSH 1.29 12/25/2011: TSH 2.45 11/23/2011: TSH 36.45, free T4 4.6 >> started LT4 08/12/2010: TSH 4.93, free T4 0.91 09/30/2009: TSH 6.26, normal free t4  She feels her best when her TSH is at 1-1.5.  She initially described weight gain of 60 pounds after her hypothyroid diagnosis, however, she then lost 45 pounds after she started exercising 06/2014.  Pt denies: - feeling nodules in neck - hoarseness - dysphagia - choking  No FH of thyroid disease or thyroid cancer. No h/o radiation tx to head or neck. No herbal supplements. No Biotin use except for the amount of multivitamins. No recent steroids use.   Mother died in her 59s (breast cancer) when pt. was 47 years old.  She works in the [employer].  ROS: + See HPI  I reviewed pt's medications, allergies, PMH, social hx, family hx, and changes were documented in the history of present illness. Otherwise, unchanged from my initial visit note.  Past Medical History:  Diagnosis Date   Cyst of breast, diffuse fibrocystic    right   Cyst of breast, left, diffuse fibrocystic    Family history of breast cancer    Family history of colon cancer    Family history of pancreatic cancer    Family history of stomach cancer    Lesion of breast    left complex sclerosing   Thyroid disease 01/13/2007   Graves disease- borderline hypo   Past Surgical History:  Procedure Laterality Date   BREAST EXCISIONAL BIOPSY Left    BREAST SURGERY  09/24/10   lft- benign sclerosing lesion   TUBAL  LIGATION  2000   hulka clips   Social History   Social History   Marital status: Married    Spouse name: N/A   Number of children: 3   Occupational History   Geologist, engineering - [employer]    Social History Main Topics   Smoking status: Former Smoker   Smokeless tobacco: Not on file     Comment: quit in 2012   Alcohol use 2 drinks once a week: Wine, beer, liquor     Drug use: No   Current Outpatient Medications on File Prior to Visit  Medication Sig Dispense Refill   ALPRAZolam (XANAX) 0.25 MG tablet Ad lib.     COVID-19 mRNA Vac-TriS, Pfizer, (PFIZER-BIONT COVID-19 VAC-TRIS) SUSP injection Inject into the muscle. 0.3 mL 0   Multiple Vitamin (MULTIVITAMIN) capsule Take 1 capsule by mouth daily.       SUMAtriptan (IMITREX) 50 MG tablet 1 tablet at start of migraine. may repeat x 1 dose after 1 hour as needed. max dose 2 per 24 hours.     SYNTHROID 125 MCG tablet TAKE 1 TABLET BY MOUTH DAILY BEFORE BREAKFAST. 30 tablet 0   vitamin C (ASCORBIC ACID) 500 MG tablet Take 500 mg by mouth daily.     No current facility-administered medications on file prior to visit.   No Known Allergies   Family History  Problem Relation Age of Onset   Breast cancer Mother 76   Melanoma Maternal Aunt        dx. early 79s; GT neg   Lung cancer Maternal Uncle    Colon cancer Paternal Aunt        dx. late 84s   Pancreatic cancer Paternal Uncle        dx mid 67s   Heart attack Maternal Grandfather    Colon cancer Paternal Grandmother        dx over 49   Pancreatic cancer Other        MGM's brother   Breast cancer Other        MGMs sister   Breast cancer Other        MGMs sister   Stomach cancer Other        MGMs sister   Kidney cancer Other        MGMs brother   Also, - Diabetes in paternal grandmother - Hypertension in heart disease in maternal grandmother  PE: BP 110/60   Pulse 87   Ht 5\' 7"  (1.702 m)   Wt 218 lb 3.2 oz (99 kg)   LMP 11/20/2022   SpO2 96%   BMI  34.17 kg/m  Wt Readings from Last 3 Encounters:  01/04/23 218 lb 3.2 oz (99 kg)  12/03/21 204 lb (92.5 kg)  12/04/20 204 lb 12.8 oz (92.9 kg)   Constitutional: overweight, in NAD Eyes:  EOMI,  no exophthalmos ENT: no neck masses, no cervical lymphadenopathy Cardiovascular: RRR, No MRG Respiratory: CTA B Musculoskeletal: no deformities Skin:no rashes Neurological: + mild tremor with outstretched hands L>R  ASSESSMENT: 1. Post ablative Hypothyroidism - Developed in 2011, after RAI treatment for Graves' disease in 2009  PLAN:  1. Patient with longstanding post ablative, hypothyroidism, previously on generic levothyroxine but now on Synthroid d.a.w. with significant improvement in her symptoms after changing the formulation -She continues to have intermittent fatigue, hot flashes and irregular menstrual cycles along with menstrual migraines.  She feels that these are related to perimenopause. -Latest TSH level was normal in 08/2022: 0.960 - she continues on Synthroid d.a.w. 125 mcg daily - pt feels good on this dose.  She gained 14 pounds since last visit, part of it could be winter closing.  She does mention that she noticed an increase in weight starting in the summer, though.  She attributes this to the lack of exercise as she is quite busy. - we discussed about taking the thyroid hormone every day, with water, >30 minutes before breakfast, separated by >4 hours from acid reflux medications, calcium, iron, multivitamins. Pt. is taking it correctly. - will check thyroid tests today: TSH and fT4 - If labs are abnormal, she will need to return for repeat TFTs in 1.5 months - OTW, I will have her return in 1 year  Needs refills.  Carlus Pavlov, MD PhD [facility] Endocrinology

## 2023-01-05 LAB — T4, FREE: Free T4: 1.7 ng/dL (ref 0.8–1.8)

## 2023-01-05 LAB — TSH: TSH: 0.74 m[IU]/L

## 2023-01-07 MED ORDER — SYNTHROID 125 MCG PO TABS: ORAL_TABLET | ORAL | 3 refills | Status: DC

## 2023-01-07 NOTE — Addendum Note (Signed)
Addended by: Carlus Pavlov on: 01/07/2023 12:39 PM   Modules accepted: Orders

## 2023-01-14 ENCOUNTER — Encounter: Payer: Self-pay | Admitting: Internal Medicine

## 2023-01-14 MED ORDER — LEVOTHYROXINE SODIUM 112 MCG PO TABS: 112.0000 ug | ORAL_TABLET | Freq: Every day | ORAL | 3 refills | Status: DC

## 2023-02-24 ENCOUNTER — Other Ambulatory Visit: Payer: Self-pay

## 2023-03-01 ENCOUNTER — Other Ambulatory Visit: Payer: Self-pay

## 2023-07-20 ENCOUNTER — Other Ambulatory Visit: Payer: Self-pay | Admitting: Internal Medicine

## 2024-01-03 ENCOUNTER — Ambulatory Visit: Payer: BC Managed Care – PPO | Admitting: Internal Medicine

## 2024-01-10 ENCOUNTER — Other Ambulatory Visit

## 2024-01-10 ENCOUNTER — Other Ambulatory Visit: Payer: Self-pay | Admitting: Family Medicine

## 2024-01-10 ENCOUNTER — Ambulatory Visit: Admitting: Internal Medicine

## 2024-01-10 ENCOUNTER — Encounter: Payer: Self-pay | Admitting: Internal Medicine

## 2024-01-10 VITALS — BP 118/60 | HR 82 | Ht 67.0 in | Wt 225.4 lb

## 2024-01-10 DIAGNOSIS — E890 Postprocedural hypothyroidism: Secondary | ICD-10-CM

## 2024-01-10 DIAGNOSIS — Z1231 Encounter for screening mammogram for malignant neoplasm of breast: Secondary | ICD-10-CM

## 2024-01-10 NOTE — Patient Instructions (Addendum)
Please continue Synthroid 125 mcg every day.  Take the thyroid hormone every day, with water, at least 30 minutes before breakfast, separated by at least 4 hours from: - acid reflux medications - calcium - iron - multivitamins  Please stop at the lab.  Please come back for a follow-up appointment in 1 year. 

## 2024-01-10 NOTE — Progress Notes (Signed)
 Patient ID: Carmen Greene, female   DOB: 11/11/1975, 48 y.o.   MRN: [id number]  HPI  Carmen Greene is a 48 y.o.-year-old female, initially referred by her PCP, Dr. Flint, presenting for follow-up for hypothyroidism. Last visit 1 year ago.  Interim history: Still continues to have night sweats. No hot flushes. She continues to have irregular menses and menstrual migraines but these improved in last 2 mo. She gained several pounds since last visit.  Reviewed history: Pt. 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  Will change to Synthroid  brand name in 09/2015.  She felt better after the change.  In 08/2018, she complained of: Brittle fingernails, some weight gain, forgetfulness, irregular menses.  She also experienced insomnia and mood swings and even had 1 panic attack.  At that time, her TSH was higher than her target and we increased her Synthroid  slightly.  She felt better afterwards the the above symptoms resolved or improved significantly.  In 05/2019, she contacted me with increased fatigue, weight gain, decreased and insomnia.  TFTs were normal.  She later attributed the symptoms to perimenopause.  At last visit, she was on Synthroid  125 mcg daily, but even though the thyroid  tests were in the normal range, she was interested in switching to a lower dose due to insomnia.  We changed to Synthroid  112 mcg daily, which she took but she started to experience wt gain >> she increased back to 125 mcg daily ~4 mo ago: - in am - fasting - at least 30 min from b'fast - + calcium at night - no iron - + multivitamins at night - no PPIs - + on Biotin 1000 mcg in multivitamins-last dose 3-4 days ago   Reviewed her TFTs: 08/26/2023: TSH 0.45 (0.34-4.5) -she cannot remember if she increased the dose of Synthroid  before these tests Lab Results  Component Value Date   TSH 0.74  01/04/2023   TSH 0.54 12/03/2021   TSH 0.54 12/04/2020   TSH 0.80 12/05/2019   TSH 0.83 05/31/2019   TSH 2.01 12/02/2018   TSH 0.66 06/17/2018   TSH 2.66 04/29/2018   TSH 1.21 08/11/2017   TSH 0.88 08/18/2016   FREET4 1.7 01/04/2023   FREET4 1.66 (H) 12/03/2021   FREET4 1.41 12/04/2020   FREET4 1.47 12/05/2019   FREET4 1.20 05/31/2019   FREET4 1.4 12/02/2018   FREET4 1.48 06/17/2018   FREET4 1.17 04/29/2018   FREET4 1.31 08/11/2017   FREET4 1.24 08/18/2016  08/18/2022: TSH 0.960 08/09/2018: TSH 1.15, normal  Prev: 07/31/2105: TSH 2.52 - alternating the 112 with 125 mcg qod LT4 10/31/2014: TSH 0.47 - alternating the 112 with 125 mcg qod LT4 09/14/2014: TSH 0.442 - on 125 mcg daily ... 01/18/2013: TSH 2.56 12/05/2012: TSH 6.88 07/18/2012: TSH 1.29 12/25/2011: TSH 2.45 11/23/2011: TSH 36.45, free T4 4.6 >> started LT4 08/12/2010: TSH 4.93, free T4 0.91 09/30/2009: TSH 6.26, normal free t4  She feels her best when her TSH is at 1-1.5.  She initially described weight gain of 60 pounds after her hypothyroid diagnosis, however, she then lost 45 pounds after she started exercising 06/2014.  Pt denies: - feeling nodules in neck - hoarseness - dysphagia - choking  No FH of thyroid  disease or thyroid  cancer. No h/o radiation tx to head or neck. No herbal supplements. No Biotin use except for the amount of multivitamins. No recent  steroids use.   Mother died in her 75s (breast cancer) when pt. was 48 years old.  She works in the [employer].  ROS: + See HPI  I reviewed pt's medications, allergies, PMH, social hx, family hx, and changes were documented in the history of present illness. Otherwise, unchanged from my initial visit note.  Past Medical History:  Diagnosis Date   Cyst of breast, diffuse fibrocystic    right   Cyst of breast, left, diffuse fibrocystic    Family history of breast cancer    Family history of colon cancer    Family history of  pancreatic cancer    Family history of stomach cancer    Lesion of breast    left complex sclerosing   Thyroid  disease 01/13/2007   Graves disease- borderline hypo   Past Surgical History:  Procedure Laterality Date   BREAST EXCISIONAL BIOPSY Left    BREAST SURGERY  09/24/10   lft- benign sclerosing lesion   TUBAL LIGATION  2000   hulka clips   Social History   Social History   Marital status: Married    Spouse name: N/A   Number of children: 3   Occupational History   Geologist, 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 0.3 mL 0   levothyroxine  (SYNTHROID ) 112 MCG tablet Take 1 tablet (112 mcg total) by mouth daily before breakfast. 45 tablet 3   Multiple Vitamin (MULTIVITAMIN) capsule Take 1 capsule by mouth daily.       SUMAtriptan (IMITREX) 50 MG tablet 1 tablet at start of migraine. may repeat x 1 dose after 1 hour as needed. max dose 2 per 24 hours.     SYNTHROID  125 MCG tablet TAKE 1 TABLET BY MOUTH DAILY BEFORE BREAKFAST. 90 tablet 3   vitamin C (ASCORBIC ACID) 500 MG tablet Take 500 mg by mouth daily.     No current facility-administered medications on file prior to visit.   No Known Allergies   Family History  Problem Relation Age of Onset   Breast cancer Mother 32   Melanoma Maternal Aunt        dx. early 42s; GT neg   Lung cancer Maternal Uncle    Colon cancer Paternal Aunt        dx. late 34s   Pancreatic cancer Paternal Uncle        dx mid 8s   Heart attack Maternal Grandfather    Colon cancer Paternal  Grandmother        dx over 34   Pancreatic cancer Other        MGM's brother   Breast cancer Other        MGMs sister   Breast cancer Other        MGMs sister   Stomach cancer Other        MGMs sister   Kidney cancer Other        MGMs brother   Also, -  Diabetes in paternal grandmother - Hypertension in heart disease in maternal grandmother  PE: BP 118/60   Pulse 82   Ht 5' 7 (1.702 m)   Wt 225 lb 6.4 oz (102.2 kg)   SpO2 93%   BMI 35.30 kg/m  Wt Readings from Last 3 Encounters:  01/10/24 225 lb 6.4 oz (102.2 kg)  01/04/23 218 lb 3.2 oz (99 kg)  12/03/21 204 lb (92.5 kg)   Constitutional: overweight, in NAD Eyes:  EOMI, no exophthalmos ENT: no neck masses, no cervical lymphadenopathy Cardiovascular: RRR, No MRG Respiratory: CTA B Musculoskeletal: no deformities Skin:no rashes Neurological: no tremor with outstretched hands  ASSESSMENT: 1. Post ablative Hypothyroidism - Developed in 2011, after RAI treatment for Graves' disease in 2009  PLAN:  1. Patient with longstanding postablative hypothyroidism, previously on generic levothyroxine  but now on Synthroid  d.a.w. with significant improvement in her symptoms after changing the formulations.  However, at last visit, she continued to have intermittent fatigue, hot flashes, irregular menstrual cycles along with menstrual migraines but she felt that these were related to perimenopause.  However, to improve sleep, she was interested in reducing the dose of her Synthroid  to 112 mcg daily.  We did this, however, even though she started to sleep better, she started to experience weight gain so she went back to the previous dose of 125 mcg daily.  She continues on this dose now. - Latest TSH was reviewed from 08/2023 and this was normal, at 0.45 (0.34-4.5).  She does not remember if she made the dose change above before or after these labs - she feels good on the Synthroid  125 mcg daily.  Before last visit, she gained 14 pounds but  part of it could have been winter clothing, but also possibly lack of exercise as she was quite busy.  At today's visit, she appears to have gained 7 pounds since then. - we discussed about taking the thyroid  hormone every day, with water, >30 minutes before breakfast, separated by >4 hours from acid reflux medications, calcium, iron, multivitamins. Pt. is taking it correctly. - will check thyroid  tests today: TSH and fT4 and change the levothyroxine  dose accordingly - If labs are abnormal, she will need to return for repeat TFTs in 1.5 months - OTW, I will have her return in 1 year  Orders Placed This Encounter  Procedures   TSH   T4, free   Needs refills.  Lela Fendt, MD PhD [facility] Endocrinology

## 2024-01-11 ENCOUNTER — Ambulatory Visit: Payer: Self-pay | Admitting: Internal Medicine

## 2024-01-11 LAB — TSH: TSH: 0.53 m[IU]/L

## 2024-01-11 LAB — T4, FREE: Free T4: 1.7 ng/dL (ref 0.8–1.8)

## 2024-01-11 MED ORDER — SYNTHROID 125 MCG PO TABS: ORAL_TABLET | ORAL | 3 refills | Status: AC

## 2024-01-11 NOTE — Addendum Note (Signed)
 Addended by: TRIXIE FILE on: 01/11/2024 08:48 AM   Modules accepted: Orders

## 2024-01-28 ENCOUNTER — Ambulatory Visit

## 2024-01-28 DIAGNOSIS — Z1231 Encounter for screening mammogram for malignant neoplasm of breast: Secondary | ICD-10-CM

## 2025-01-08 ENCOUNTER — Ambulatory Visit: Admitting: Internal Medicine
